# Patient Record
Sex: Male | Born: 1960 | Race: Black or African American | Hispanic: No | Marital: Single | Smoking: Never smoker
Health system: Southern US, Community
[De-identification: ages and names within clinical notes are randomized; demographics above are authoritative.]

## PROBLEM LIST (undated history)

## (undated) DIAGNOSIS — I10 Essential (primary) hypertension: Secondary | ICD-10-CM

---

## 1999-06-29 ENCOUNTER — Emergency Department (HOSPITAL_COMMUNITY): Admission: EM | Admit: 1999-06-29 | Discharge: 1999-06-29 | Payer: Self-pay | Admitting: Emergency Medicine

## 1999-06-30 ENCOUNTER — Encounter: Payer: Self-pay | Admitting: Emergency Medicine

## 2015-02-17 ENCOUNTER — Emergency Department (HOSPITAL_COMMUNITY)
Admission: EM | Admit: 2015-02-17 | Discharge: 2015-02-17 | Disposition: A | Discharge status: Home / Self Care | Payer: Self-pay | Attending: Emergency Medicine | Admitting: Emergency Medicine

## 2015-02-17 ENCOUNTER — Encounter (HOSPITAL_COMMUNITY): Payer: Self-pay | Admitting: *Deleted

## 2015-02-17 ENCOUNTER — Emergency Department (HOSPITAL_COMMUNITY): Payer: Self-pay

## 2015-02-17 DIAGNOSIS — Z79899 Other long term (current) drug therapy: Secondary | ICD-10-CM | POA: Insufficient documentation

## 2015-02-17 DIAGNOSIS — R0789 Other chest pain: Secondary | ICD-10-CM | POA: Insufficient documentation

## 2015-02-17 LAB — CBC
HCT: 44.4 % (ref 39.0–52.0)
Hemoglobin: 15.4 g/dL (ref 13.0–17.0)
MCH: 29.7 pg (ref 26.0–34.0)
MCHC: 34.7 g/dL (ref 30.0–36.0)
MCV: 85.5 fL (ref 78.0–100.0)
Platelets: 187 10*3/uL (ref 150–400)
RBC: 5.19 MIL/uL (ref 4.22–5.81)
RDW: 13.3 % (ref 11.5–15.5)
WBC: 3.8 10*3/uL — ABNORMAL LOW (ref 4.0–10.5)

## 2015-02-17 LAB — BASIC METABOLIC PANEL
Anion gap: 5 (ref 5–15)
BUN: 12 mg/dL (ref 6–20)
CALCIUM: 9.2 mg/dL (ref 8.9–10.3)
CO2: 29 mmol/L (ref 22–32)
CREATININE: 1.29 mg/dL — AB (ref 0.61–1.24)
Chloride: 109 mmol/L (ref 101–111)
GLUCOSE: 105 mg/dL — AB (ref 70–99)
Potassium: 4.1 mmol/L (ref 3.5–5.1)
Sodium: 143 mmol/L (ref 135–145)

## 2015-02-17 LAB — I-STAT TROPONIN, ED
TROPONIN I, POC: 0.01 ng/mL (ref 0.00–0.08)
TROPONIN I, POC: 0 ng/mL (ref 0.00–0.08)

## 2015-02-17 LAB — BRAIN NATRIURETIC PEPTIDE: B Natriuretic Peptide: 43.2 pg/mL (ref 0.0–100.0)

## 2015-02-17 IMAGING — CR DG CHEST 2V
2 series · 2 of 2 positions shown · non-contrast
Comparison: None currently available

CLINICAL DATA: Chest pain intermittent dizziness

EXAM:
CHEST  2 VIEW

[w chest pa]
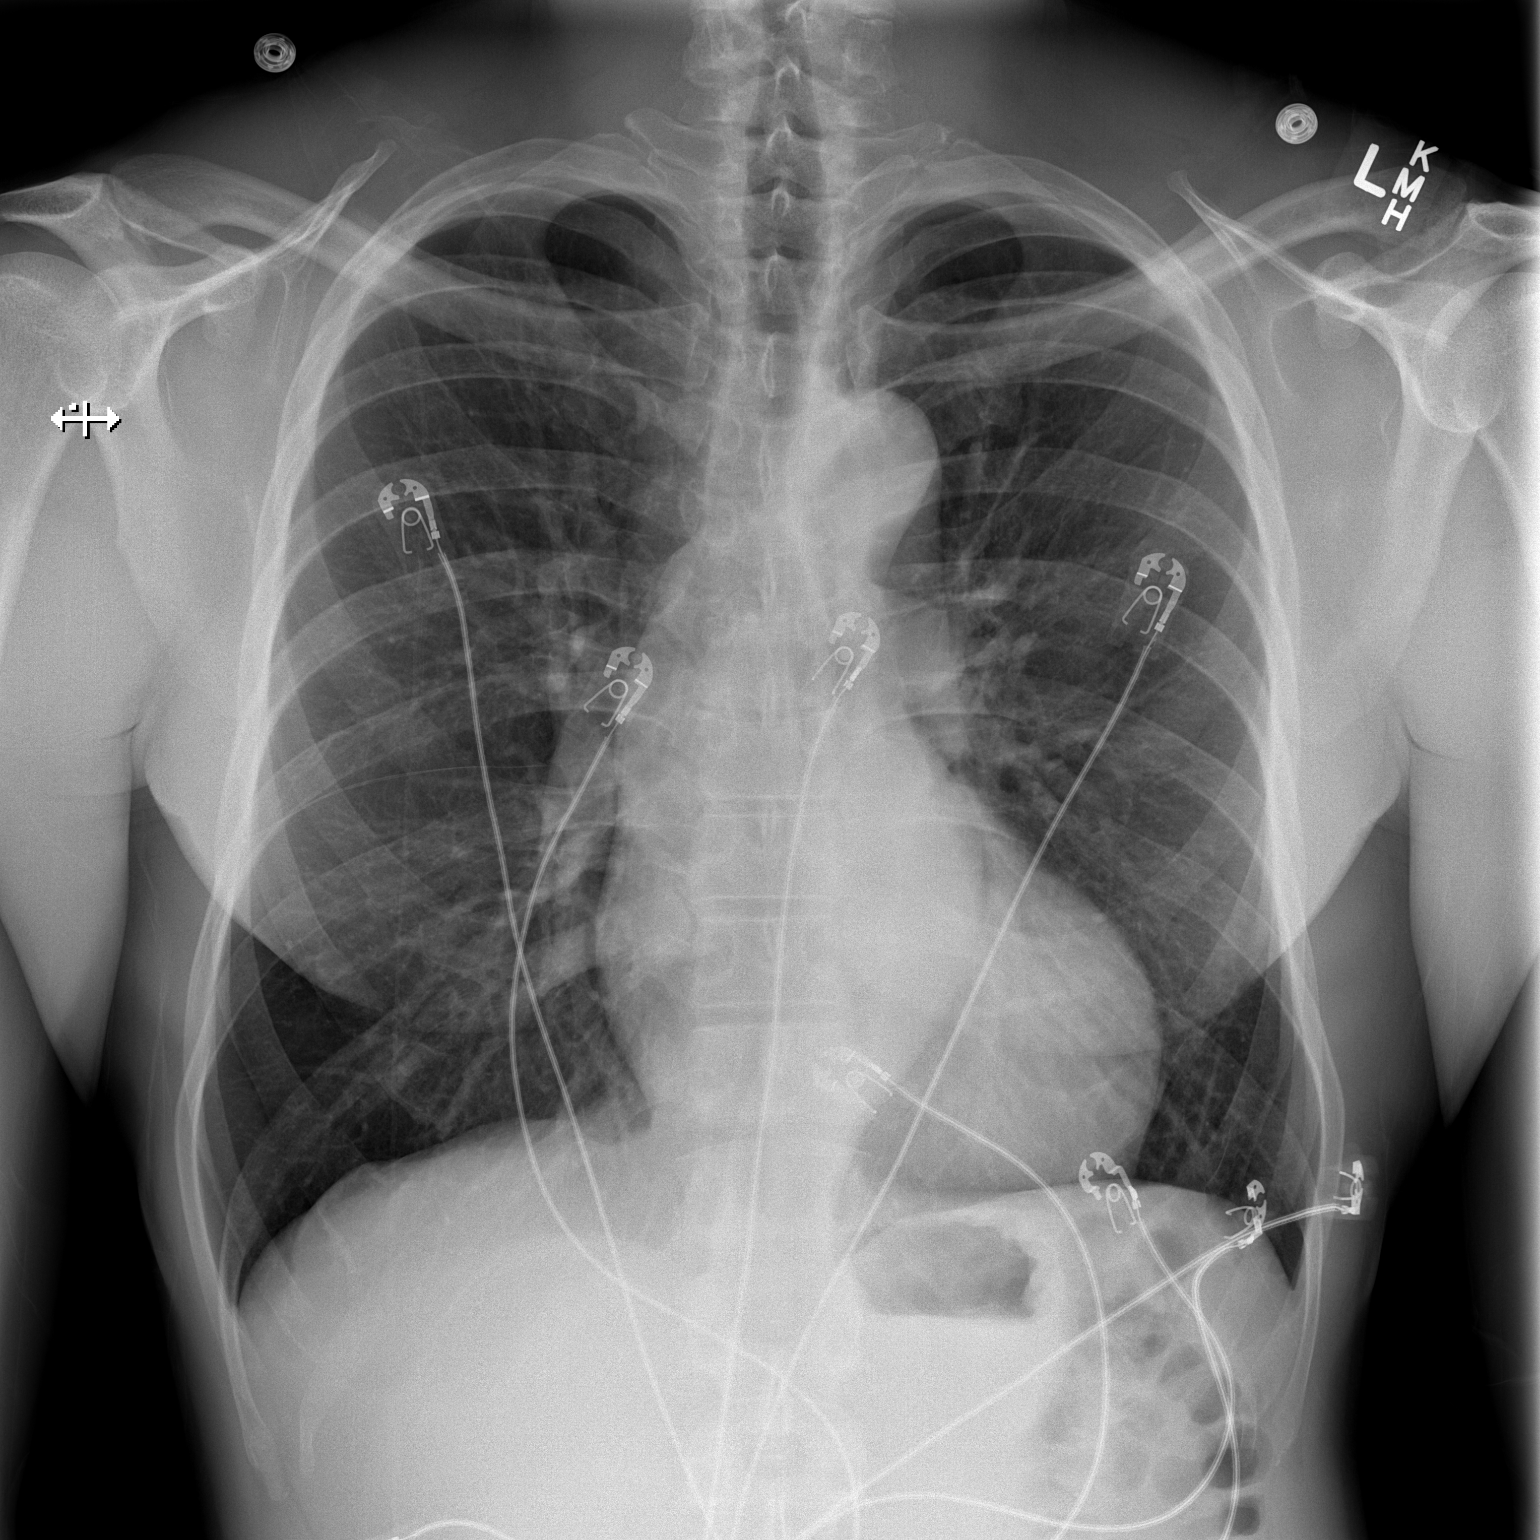

[w chest lat]
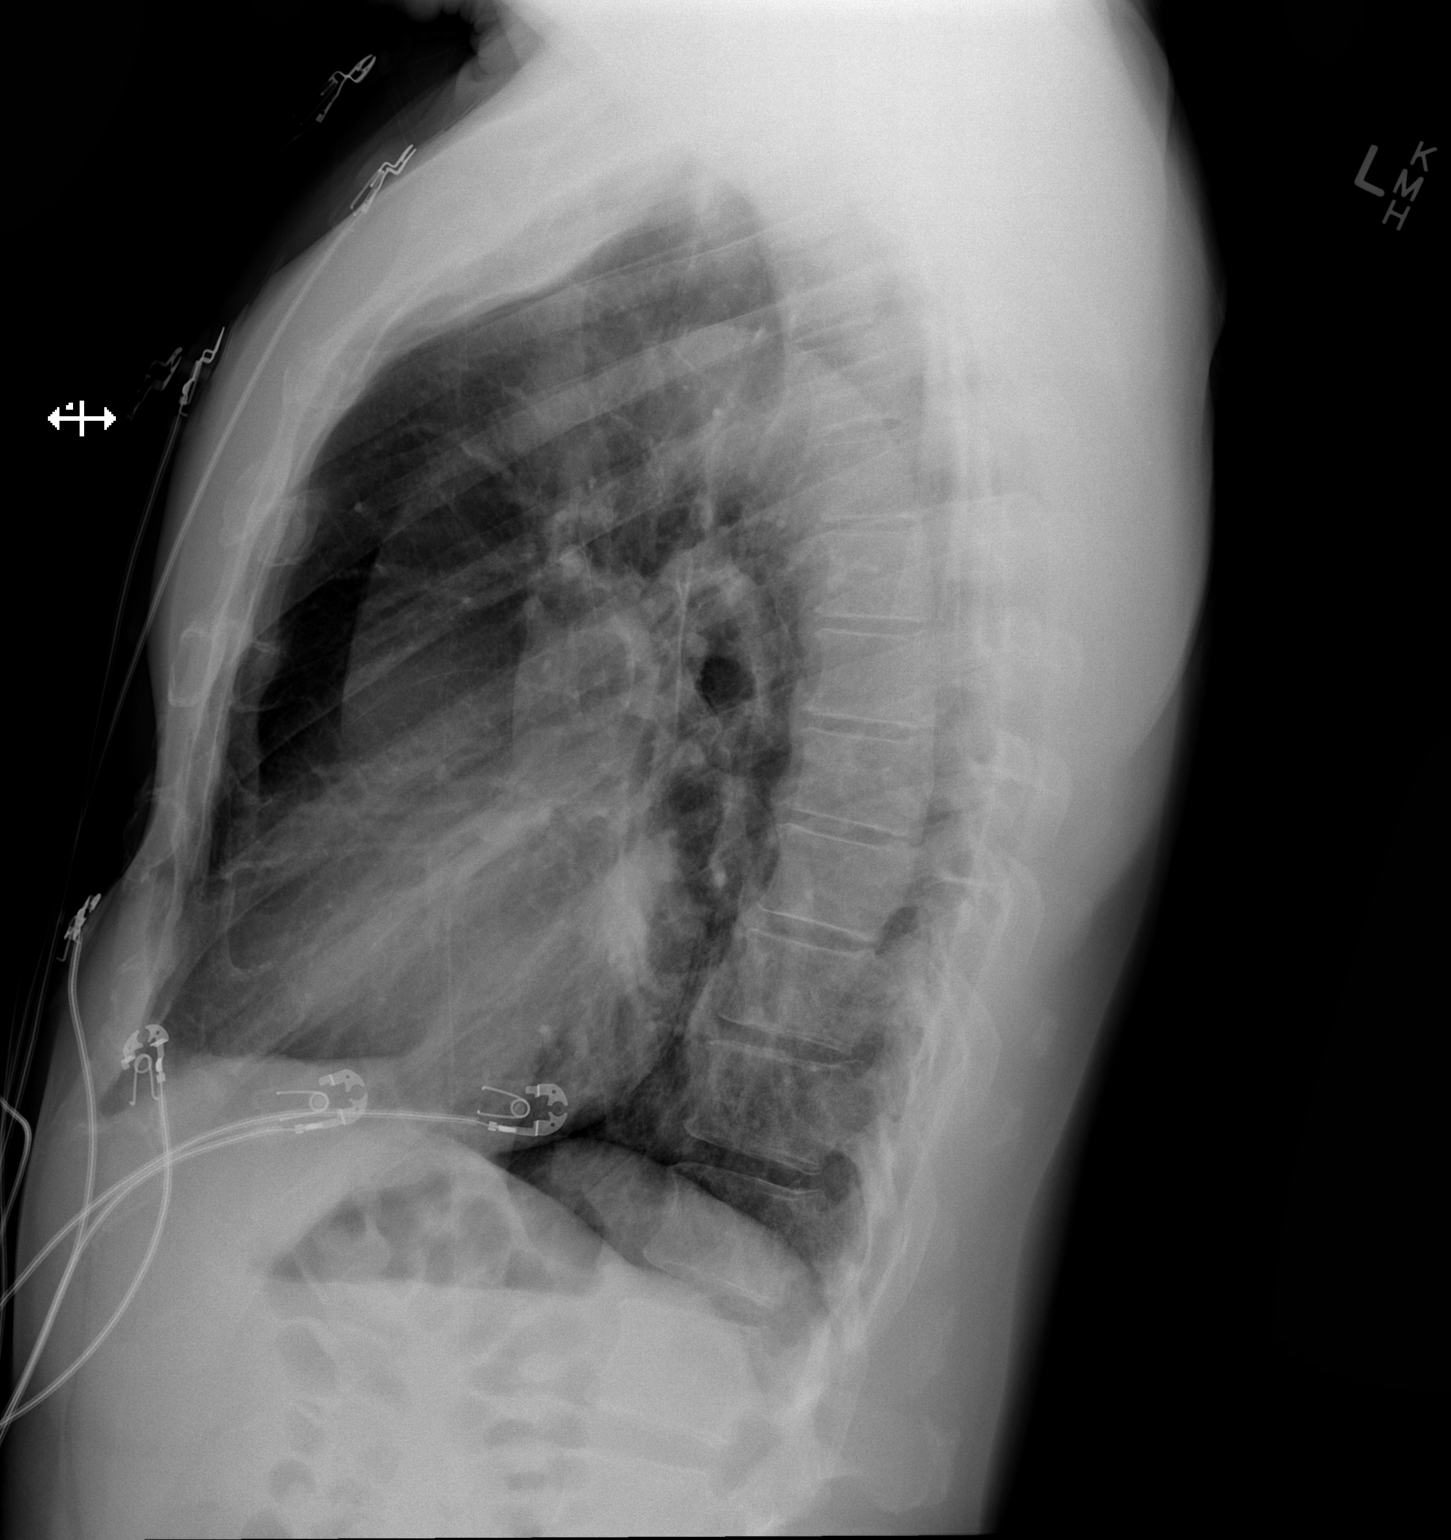

[2 of 2 positions shown; findings below may reference images not displayed]

FINDINGS: Aortic tortuosity and prominent left ventricular size for age. No
evidence of aortic valve calcification. This appearance can be seen
with chronic hypertension. Borderline Hyperinflation. There is no
edema, consolidation, effusion, or pneumothorax. No acute osseous
findings.
IMPRESSION: 1. No active cardiopulmonary disease.
2. Prominent left ventricle and aortic tortuosity for age, question
chronic hypertension.
3. Borderline hyperinflation.

## 2015-02-17 MED ORDER — HYDROCHLOROTHIAZIDE 12.5 MG PO TABS: 12.5000 mg | ORAL_TABLET | Freq: Every day | ORAL | Status: DC

## 2015-02-17 NOTE — ED Provider Notes (Signed)
BP 159/111 mmHg  Pulse 62  Temp(Src) 98.1 F (36.7 C) (Oral)  Resp 20  Ht 6' (1.829 m)  Wt 170 lb (77.111 kg)  BMI 23.05 kg/m2  SpO2 100%  take in sign out from PA Cartner.  The patient  Has had sharp chest pains. Intermittent, non exertional  Patient works out everyday 2 negative troponins,  heart score of 1. I spent time discussing hypertension with the patient and lifestyle modifications.   [facility]   [place], [place] 02/19/15 3570  Gwyneth Sprout, MD 02/19/15 [phone]

## 2015-02-17 NOTE — Discharge Instructions (Signed)
Chest Pain (Nonspecific) °It is often hard to give a specific diagnosis for the cause of chest pain. There is always a chance that your pain could be related to something serious, such as a heart attack or a blood clot in the lungs. You need to follow up with your health care provider for further evaluation. °CAUSES  °· Heartburn. °· Pneumonia or bronchitis. °· Anxiety or stress. °· Inflammation around your heart (pericarditis) or lung (pleuritis or pleurisy). °· A blood clot in the lung. °· A collapsed lung (pneumothorax). It can develop suddenly on its own (spontaneous pneumothorax) or from trauma to the chest. °· Shingles infection (herpes zoster virus). °The chest wall is composed of bones, muscles, and cartilage. Any of these can be the source of the pain. °· The bones can be bruised by injury. °· The muscles or cartilage can be strained by coughing or overwork. °· The cartilage can be affected by inflammation and become sore (costochondritis). °DIAGNOSIS  °Lab tests or other studies may be needed to find the cause of your pain. Your health care provider may have you take a test called an ambulatory electrocardiogram (ECG). An ECG records your heartbeat patterns over a 24-hour period. You may also have other tests, such as: °· Transthoracic echocardiogram (TTE). During echocardiography, sound waves are used to evaluate how blood flows through your heart. °· Transesophageal echocardiogram (TEE). °· Cardiac monitoring. This allows your health care provider to monitor your heart rate and rhythm in real time. °· Holter monitor. This is a portable device that records your heartbeat and can help diagnose heart arrhythmias. It allows your health care provider to track your heart activity for several days, if needed. °· Stress tests by exercise or by giving medicine that makes the heart beat faster. °TREATMENT  °· Treatment depends on what may be causing your chest pain. Treatment may include: °¨ Acid blockers for  heartburn. °¨ Anti-inflammatory medicine. °¨ Pain medicine for inflammatory conditions. °¨ Antibiotics if an infection is present. °· You may be advised to change lifestyle habits. This includes stopping smoking and avoiding alcohol, caffeine, and chocolate. °· You may be advised to keep your head raised (elevated) when sleeping. This reduces the chance of acid going backward from your stomach into your esophagus. °Most of the time, nonspecific chest pain will improve within 2-3 days with rest and mild pain medicine.  °HOME CARE INSTRUCTIONS  °· If antibiotics were prescribed, take them as directed. Finish them even if you start to feel better. °· For the next few days, avoid physical activities that bring on chest pain. Continue physical activities as directed. °· Do not use any tobacco products, including cigarettes, chewing tobacco, or electronic cigarettes. °· Avoid drinking alcohol. °· Only take medicine as directed by your health care provider. °· Follow your health care provider's suggestions for further testing if your chest pain does not go away. °· Keep any follow-up appointments you made. If you do not go to an appointment, you could develop lasting (chronic) problems with pain. If there is any problem keeping an appointment, call to reschedule. °SEEK MEDICAL CARE IF:  °· Your chest pain does not go away, even after treatment. °· You have a rash with blisters on your chest. °· You have a fever. °SEEK IMMEDIATE MEDICAL CARE IF:  °· You have increased chest pain or pain that spreads to your arm, neck, jaw, back, or abdomen. °· You have shortness of breath. °· You have an increasing cough, or you cough   up blood.  You have severe back or abdominal pain.  You feel nauseous or vomit.  You have severe weakness.  You faint.  You have chills. This is an emergency. Do not wait to see if the pain will go away. Get medical help at once. Call your local emergency services (911 in [place] Do not drive  yourself to the hospital. MAKE SURE YOU:   Understand these instructions.  Will watch your condition.  Will get help right away if you are not doing well or get worse. Document Released: 07/11/2005 Document Revised: 10/06/2013 Document Reviewed: 05/06/2008 [facility] Patient Information 2015 [place], [place]. This information is not intended to replace advice given to you by your health care provider. Make sure you discuss any questions you have with your health care provider.   You were evaluated in the ED today for your chest discomfort. There does not appear to be an emergent cause for your symptoms at this time. Your lab work, EKG, chest x-ray were reassuring. It is important for you to follow-up with primary care in order to establish medical care. He will also need to follow-up with cardiology for further evaluation and management of your symptoms. Please take your blood pressure medicine as directed. Return to ED for new or worsening symptoms.

## 2015-02-17 NOTE — Progress Notes (Addendum)
CM spoke with pt who confirms self pay [facility] resident with no pcp. CM discussed and provided written information for self pay pcps vs EDPs, importance of pcp for f/u care, www.needymeds.org, www.goodrx.com, discounted pharmacies and other [employer] such as [facility] [facility] [facility] , [facility], affordable care act,  financial assistance, self pay dental services, [place] med assist, DSS and  health department  Reviewed resources for [employer] self pay pcps like [facility], family medicine at [employer] [address], [facility] family practice, general medical clinics, [facility] urgent care plus others, medication resources, [facility] out patient pharmacies and housing Pt voiced understanding and appreciation of resources provided   Provided [facility] contact information Pt agreed to a referral Cm completed referral    Pt prefers to transition to [facility] services until "september" when he gets "married and gets on my fiance's insurance"

## 2015-02-17 NOTE — ED Provider Notes (Signed)
CSN: [id number]     Arrival date & time 02/17/15  1334 History   First MD Initiated Contact with Patient 02/17/15 1348     Chief Complaint  Patient presents with  . Chest Pain     (Consider location/radiation/quality/duration/timing/severity/associated sxs/prior Treatment) HPI Daniel Franklin is a 54 y.o. male who comes in for evaluation of intermittent dizziness and chest pain. Patient states he has had an intermittent history of dizziness for the past 6 months, will occur while walking, resting. Denies spinning sensation but reports "feeling off balance". This is a fleeting sensation. Denies any associated headache, numbness or weakness. He reports associated chest pain that is also been intermittent over the past few months. He characterizes the sensation as a sharp pain in his left chest that is exacerbated with certain movements and deep respiration. He rates the pain as a 2/10. Reports that he is generally very active and works out every day and has not experienced chest discomfort following strenuous exercise. Denies headache, shortness of breath, nausea or vomiting, diaphoresis, abdominal pain, urinary symptoms, syncope, dark or bloody stools. Also denies recent travel, surgeries, unilateral leg swelling, hemoptysis, personal history of cancer. Nonsmoker denies cocaine or other illicit substance use.  History reviewed. No pertinent past medical history. History reviewed. No pertinent past surgical history. History reviewed. No pertinent family history. History  Substance Use Topics  . Smoking status: Never Smoker   . Smokeless tobacco: Not on file  . Alcohol Use: Not on file    Review of Systems A 10 point review of systems was completed and was negative except for pertinent positives and negatives as mentioned in the history of present illness     Allergies  Review of patient's allergies indicates no known allergies.  Home Medications   Prior to Admission medications    Medication Sig Start Date End Date Taking? Authorizing Provider  hydrochlorothiazide (HYDRODIURIL) 12.5 MG tablet Take 1 tablet (12.5 mg total) by mouth daily. 02/17/15   Joycie Peek, PA-C   BP 174/111 mmHg  Pulse 88  Temp(Src) 98.5 F (36.9 C) (Oral)  Resp 20  Ht 6' (1.829 m)  Wt 170 lb (77.111 kg)  BMI 23.05 kg/m2  SpO2 100% Physical Exam  Constitutional: He is oriented to person, place, and time. He appears well-developed and well-nourished. No distress.  Overall well-appearing, healthy black male.  HENT:  Head: Normocephalic and atraumatic.  Mouth/Throat: Oropharynx is clear and moist.  Eyes: Conjunctivae are normal. Pupils are equal, round, and reactive to light. Right eye exhibits no discharge. Left eye exhibits no discharge. No scleral icterus.  Neck: Normal range of motion. Neck supple.  Cardiovascular: Normal rate, regular rhythm and normal heart sounds.   Pulmonary/Chest: Effort normal and breath sounds normal. No respiratory distress. He has no wheezes. He has no rales. He exhibits no tenderness.  Abdominal: Soft. He exhibits no distension and no mass. There is no tenderness. There is no rebound and no guarding.  Musculoskeletal: He exhibits no tenderness.  Neurological: He is alert and oriented to person, place, and time.  Cranial Nerves II-XII grossly intact  Skin: Skin is warm. No rash noted. He is not diaphoretic.  Psychiatric: He has a normal mood and affect.  Nursing note and vitals reviewed.   ED Course  Procedures (including critical care time) Labs Review Labs Reviewed  CBC - Abnormal; Notable for the following:    WBC 3.8 (*)    All other components within normal limits  BASIC METABOLIC PANEL -  Abnormal; Notable for the following:    Glucose, Bld 105 (*)    Creatinine, Ser 1.29 (*)    All other components within normal limits  BRAIN NATRIURETIC PEPTIDE  I-STAT TROPOININ, ED    Imaging Review Dg Chest 2 View  02/17/2015   CLINICAL DATA:  Chest  pain intermittent dizziness  EXAM: CHEST  2 VIEW  COMPARISON:  None currently available  FINDINGS: Aortic tortuosity and prominent left ventricular size for age. No evidence of aortic valve calcification. This appearance can be seen with chronic hypertension. Borderline Hyperinflation. There is no edema, consolidation, effusion, or pneumothorax. No acute osseous findings.  IMPRESSION: 1. No active cardiopulmonary disease. 2. Prominent left ventricle and aortic tortuosity for age, question chronic hypertension. 3. Borderline hyperinflation.   Electronically Signed   By: Marnee Spring M.D.   On: 02/17/2015 14:29     EKG Interpretation None      ED ECG REPORT   Date: 02/17/2015  Rate: 80  Rhythm: normal sinus rhythm  QRS Axis: left  Intervals: normal  ST/T Wave abnormalities: normal  Conduction Disutrbances:none  Narrative Interpretation:   Old EKG Reviewed: none available  I have personally reviewed the EKG tracing and agree with the computerized printout as noted.  Meds given in ED:  Medications - No data to display  New Prescriptions   HYDROCHLOROTHIAZIDE (HYDRODIURIL) 12.5 MG TABLET    Take 1 tablet (12.5 mg total) by mouth daily.   Filed Vitals:   02/17/15 1343  BP: 174/111  Pulse: 88  Temp: 98.5 F (36.9 C)  TempSrc: Oral  Resp: 20  Height: 6' (1.829 m)  Weight: 170 lb (77.111 kg)  SpO2: 100%    MDM  Vitals stable - WNL -afebrile Pt resting comfortably in ED. No chest discomfort in ED PE--Lung exam normal. Cardiac auscultation reveals no murmurs rubs or gallops. Grossly Benign Physical Exam Labwork: Initial/DeltaTroponin negative. EKG reassuring.  Labs otherwise noncontributory Imaging: CXR shows no active cardiopulmonary disease. Prominent left ventricle and aortic tortuosity for age, question chronic hypertension.  DDX: Patient with atypical chest discomfort. Clinical picture and exam today not consistent with ACS/dissection. Heart score 2. No evidence of  spontaneous pneumothorax, esophageal rupture or other mediastinitis. Low well's score, doubt PE. No evidence of myocarditis, endocarditis, pericarditis.   Patient care signed out to [employer], PA-C. Plan is delta troponin and if no new objective findings, patient may follow-up outpatient with cardiology. Will initiate antihypertensives with HCTZ and encourage patient to follow up with PCP/[place] [facility] for further evaluation and management of symptoms. Prior to sign out, I discussed and reviewed this case with my attending, Dr. Rhunette Croft   Final diagnoses:  Chest discomfort        Joycie Peek, PA-C 02/17/15 1601  Derwood Kaplan, MD 02/17/15 1623

## 2015-02-17 NOTE — ED Notes (Signed)
Pt in c/o intermittent dizziness for the last week, this morning developed chest pain, fatigue, states dizziness became worse, no distress noted

## 2015-03-01 ENCOUNTER — Ambulatory Visit: Payer: Self-pay

## 2016-04-13 ENCOUNTER — Inpatient Hospital Stay (HOSPITAL_COMMUNITY)
Admission: EM | Admit: 2016-04-13 | Discharge: 2016-04-17 | DRG: 281 | Disposition: A | Discharge status: Home / Self Care | Payer: Self-pay | Attending: Internal Medicine | Admitting: Internal Medicine

## 2016-04-13 ENCOUNTER — Encounter (HOSPITAL_COMMUNITY): Payer: Self-pay | Admitting: Emergency Medicine

## 2016-04-13 ENCOUNTER — Emergency Department (HOSPITAL_COMMUNITY): Payer: Self-pay

## 2016-04-13 DIAGNOSIS — I160 Hypertensive urgency: Secondary | ICD-10-CM | POA: Diagnosis present

## 2016-04-13 DIAGNOSIS — R079 Chest pain, unspecified: Secondary | ICD-10-CM | POA: Diagnosis present

## 2016-04-13 DIAGNOSIS — I472 Ventricular tachycardia: Secondary | ICD-10-CM | POA: Diagnosis present

## 2016-04-13 DIAGNOSIS — I214 Non-ST elevation (NSTEMI) myocardial infarction: Secondary | ICD-10-CM | POA: Diagnosis present

## 2016-04-13 DIAGNOSIS — N189 Chronic kidney disease, unspecified: Secondary | ICD-10-CM | POA: Diagnosis present

## 2016-04-13 DIAGNOSIS — Z8249 Family history of ischemic heart disease and other diseases of the circulatory system: Secondary | ICD-10-CM

## 2016-04-13 DIAGNOSIS — R7989 Other specified abnormal findings of blood chemistry: Secondary | ICD-10-CM | POA: Diagnosis present

## 2016-04-13 DIAGNOSIS — I169 Hypertensive crisis, unspecified: Principal | ICD-10-CM | POA: Diagnosis present

## 2016-04-13 DIAGNOSIS — Z9114 Patient's other noncompliance with medication regimen: Secondary | ICD-10-CM

## 2016-04-13 DIAGNOSIS — N179 Acute kidney failure, unspecified: Secondary | ICD-10-CM | POA: Diagnosis present

## 2016-04-13 DIAGNOSIS — R778 Other specified abnormalities of plasma proteins: Secondary | ICD-10-CM

## 2016-04-13 DIAGNOSIS — R0789 Other chest pain: Secondary | ICD-10-CM | POA: Diagnosis present

## 2016-04-13 DIAGNOSIS — I429 Cardiomyopathy, unspecified: Secondary | ICD-10-CM | POA: Insufficient documentation

## 2016-04-13 DIAGNOSIS — I471 Supraventricular tachycardia: Secondary | ICD-10-CM | POA: Diagnosis present

## 2016-04-13 DIAGNOSIS — I130 Hypertensive heart and chronic kidney disease with heart failure and stage 1 through stage 4 chronic kidney disease, or unspecified chronic kidney disease: Secondary | ICD-10-CM | POA: Diagnosis present

## 2016-04-13 DIAGNOSIS — E860 Dehydration: Secondary | ICD-10-CM | POA: Diagnosis present

## 2016-04-13 DIAGNOSIS — R001 Bradycardia, unspecified: Secondary | ICD-10-CM | POA: Diagnosis present

## 2016-04-13 DIAGNOSIS — I161 Hypertensive emergency: Secondary | ICD-10-CM | POA: Diagnosis present

## 2016-04-13 DIAGNOSIS — I5042 Chronic combined systolic (congestive) and diastolic (congestive) heart failure: Secondary | ICD-10-CM | POA: Diagnosis present

## 2016-04-13 DIAGNOSIS — I10 Essential (primary) hypertension: Secondary | ICD-10-CM

## 2016-04-13 DIAGNOSIS — I255 Ischemic cardiomyopathy: Secondary | ICD-10-CM | POA: Diagnosis present

## 2016-04-13 HISTORY — DX: Essential (primary) hypertension: I10

## 2016-04-13 LAB — BASIC METABOLIC PANEL
Anion gap: 6 (ref 5–15)
BUN: 17 mg/dL (ref 6–20)
CALCIUM: 9.2 mg/dL (ref 8.9–10.3)
CO2: 27 mmol/L (ref 22–32)
CREATININE: 1.25 mg/dL — AB (ref 0.61–1.24)
Chloride: 106 mmol/L (ref 101–111)
Glucose, Bld: 97 mg/dL (ref 65–99)
Potassium: 4.3 mmol/L (ref 3.5–5.1)
Sodium: 139 mmol/L (ref 135–145)

## 2016-04-13 LAB — CBC
HCT: 41.4 % (ref 39.0–52.0)
Hemoglobin: 14.9 g/dL (ref 13.0–17.0)
MCH: 29.5 pg (ref 26.0–34.0)
MCHC: 36 g/dL (ref 30.0–36.0)
MCV: 82 fL (ref 78.0–100.0)
PLATELETS: 178 10*3/uL (ref 150–400)
RBC: 5.05 MIL/uL (ref 4.22–5.81)
RDW: 13.9 % (ref 11.5–15.5)
WBC: 4 10*3/uL (ref 4.0–10.5)

## 2016-04-13 LAB — I-STAT TROPONIN, ED: TROPONIN I, POC: 0.08 ng/mL (ref 0.00–0.08)

## 2016-04-13 MED ORDER — AMLODIPINE BESYLATE 10 MG PO TABS
10.0000 mg | ORAL_TABLET | Freq: Once | ORAL | Status: AC
  Administered 2016-04-13: 10 mg via ORAL
  Filled 2016-04-13: qty 1

## 2016-04-13 MED ORDER — ASPIRIN 81 MG PO CHEW
324.0000 mg | CHEWABLE_TABLET | Freq: Once | ORAL | Status: AC
  Administered 2016-04-13: 324 mg via ORAL
  Filled 2016-04-13: qty 4

## 2016-04-13 NOTE — ED Notes (Signed)
Patient went to [facility] for chest tightness. Patient states this has been going on for 1-2 weeks. Patient states his pain worsened today. Per documentation from [facility] patient also has HTN today. Patient states he was told once before he had HTN, but that it hasn't been a problem.

## 2016-04-13 NOTE — Progress Notes (Signed)
EDCM spoke to patient at bedside. Patient confirms he does not have a pcp or insurance living in [place].  [facility] provided patient with pamphlet to [facility], informed patient of services there and walk in times.  EDCM also provided patient with list of pcps who accept self pay patients, list of discount pharmacies and websites needymeds.org and GoodRX.com for medication assistance, phone number to inquire about the orange card, phone number to inquire about Medicaid, phone number to inquire about the Affordable Care Act, financial resources in the community such as local churches, salvation army, urban ministries, and dental assistance for uninsured patients.  Patient thankful for resources.  No further EDCM needs at this time.

## 2016-04-13 NOTE — ED Notes (Signed)
Per wife pt will now stay for testing.

## 2016-04-13 NOTE — ED Notes (Signed)
Pt stated he has not followed up with PCP and has only taken antihypertensive meds given to him here in May. Pt aware of repeat troponin process but states he has a prior engagement at 10pm and ask if he can return tomorrow for additional testing. Pt A & O, chest tightness unchanged. Pt unwilling to allow NT to place on CCM. Family at bedside.

## 2016-04-13 NOTE — ED Provider Notes (Signed)
CSN: [id number]     Arrival date & time 04/13/16  2020 History   First MD Initiated Contact with Patient 04/13/16 2103     Chief Complaint  Patient presents with  . Chest Pain    sent by [facility]     (Consider location/radiation/quality/duration/timing/severity/associated sxs/prior Treatment) HPI   Tightness in chest for one week, across top of rib cage, doesn't radiate. When stretching feels better. Initially denies exertional component, however then reports when he does anything he feels tightness and when he lays down or rests he feels better. When working out muscles get tight (pull ups) then they stay tight. Not working out as much since this started.  No dyspnea, nausea, diaphoresis.  Pain better laying down. Not worse with arm movements.  Bayer aspirin helped a little bit.  No DM/chol/smoking  Heart disease in family--sister has pacemaker. Dad needed a stent in 62s.      Past Medical History  Diagnosis Date  . Hypertension    History reviewed. No pertinent past surgical history. History reviewed. No pertinent family history. Social History  Substance Use Topics  . Smoking status: Never Smoker   . Smokeless tobacco: None  . Alcohol Use: Yes     Comment: 1-2 x a week    Review of Systems  Constitutional: Negative for fever.  HENT: Negative for sore throat.   Eyes: Negative for visual disturbance.  Respiratory: Positive for chest tightness. Negative for cough and shortness of breath.   Cardiovascular: Positive for chest pain.  Gastrointestinal: Negative for nausea, vomiting, abdominal pain, diarrhea and blood in stool.  Genitourinary: Negative for difficulty urinating.  Musculoskeletal: Negative for back pain and neck stiffness.  Skin: Negative for rash.  Neurological: Negative for syncope and headaches.      Allergies  Review of patient's allergies indicates no known allergies.  Home Medications   Prior to Admission medications   Medication Sig Start Date End  Date Taking? Authorizing Provider  OVER THE COUNTER MEDICATION Take 1 capsule by mouth daily. Nugenix Natural Testosterone Booster   Yes Historical Provider, MD   BP 176/119 mmHg  Pulse 53  Temp(Src) 97.8 F (36.6 C) (Oral)  Resp 15  Ht 6' (1.829 m)  Wt 170 lb (77.111 kg)  BMI 23.05 kg/m2  SpO2 99% Physical Exam  Constitutional: He is oriented to person, place, and time. He appears well-developed and well-nourished. No distress.  HENT:  Head: Normocephalic and atraumatic.  Eyes: Conjunctivae and EOM are normal.  Neck: Normal range of motion.  Cardiovascular: Normal rate, regular rhythm, normal heart sounds and intact distal pulses.  Exam reveals no gallop and no friction rub.   No murmur heard. Pulmonary/Chest: Effort normal and breath sounds normal. No respiratory distress. He has no wheezes. He has no rales. He exhibits no tenderness.  Abdominal: Soft. He exhibits no distension. There is no tenderness. There is no guarding.  Musculoskeletal: He exhibits no edema.  Neurological: He is alert and oriented to person, place, and time.  Skin: Skin is warm and dry. He is not diaphoretic.  Nursing note and vitals reviewed.   ED Course  Procedures (including critical care time) Labs Review Labs Reviewed  BASIC METABOLIC PANEL - Abnormal; Notable for the following:    Creatinine, Ser 1.25 (*)    All other components within normal limits  I-STAT TROPOININ, ED - Abnormal; Notable for the following:    Troponin i, poc 0.10 (*)    All other components within normal limits  CBC  Rosezena Sensor, ED  Rosezena Sensor, ED    Imaging Review Dg Chest 2 View  04/13/2016  CLINICAL DATA:  55 year old male with chest pain and tightness EXAM: CHEST  2 VIEW COMPARISON:  Chest radiograph dated 02/17/2015 FINDINGS: Two views of the chest do not demonstrate a focal consolidation. There is minimal blunting of the right costophrenic angle scratched similar to prior study which may represent a small  right pleural effusion versus scarring. There is no pneumothorax but stable for cardiac silhouette. No acute osseous pathology. IMPRESSION: No acute cardiopulmonary process. Electronically Signed   By: Elgie Collard M.D.   On: 04/13/2016 20:45   I have personally reviewed and evaluated these images and lab results as part of my medical decision-making.   EKG Interpretation   Date/Time:  Friday April 13 2016 20:37:51 EDT Ventricular Rate:  49 PR Interval:    QRS Duration: 100 QT Interval:  422 QTC Calculation: 381 R Axis:   23 Text Interpretation:  Sinus bradycardia Left ventricular hypertrophy  Confirmed by Lincoln Brigham [phone]) on 04/13/2016 8:40:52 PM Also confirmed by  [facility] MD, Demesha Boorman ([id number])  on 04/13/2016 10:58:39 PM      MDM   Final diagnoses:  Chest pain, unspecified chest pain type  Essential hypertension  Elevated troponin    55 year old male with history of hypertension not on medications presents with concern of chest pain. Differential diagnosis for chest pain includes pulmonary embolus, dissection, pneumothorax, pneumonia, ACS, myocarditis, pericarditis.  EKG was done and evaluate by me and showed no acute ST changes and no signs of pericarditis, findings of bradycardia and LVH.  Chest x-ray was done and evaluated by me and radiology and showed no sign of pneumonia or pneumothorax.  Patient without dyspnea, no hypoxia, no tachypnea, low suspicion for pulmonary embolus. He has equal pulses in all 4 extremities, no sign of dissection on x-ray, and have low suspicion for this based on history and physical exam. Initial troponin was negative, however delta troponin is elevated at 0.1. Patient received aspirin.  Elevated troponin may be secondary to ACS versus hypertension. Patient was chest pain-free at time of my evaluation. Given po amlodipine for htn given CP free.  Consulted hospitalist for admission.   Alvira Monday, MD 04/14/16 1224

## 2016-04-13 NOTE — ED Notes (Signed)
Pt stated he did not need to be placed on the monitor, he is leaving at 10pm. He needs to be somewhere

## 2016-04-14 ENCOUNTER — Observation Stay (HOSPITAL_COMMUNITY): Payer: Self-pay

## 2016-04-14 ENCOUNTER — Encounter (HOSPITAL_COMMUNITY): Payer: Self-pay | Admitting: Nurse Practitioner

## 2016-04-14 DIAGNOSIS — R0789 Other chest pain: Secondary | ICD-10-CM | POA: Diagnosis present

## 2016-04-14 DIAGNOSIS — N179 Acute kidney failure, unspecified: Secondary | ICD-10-CM | POA: Diagnosis present

## 2016-04-14 DIAGNOSIS — R7989 Other specified abnormal findings of blood chemistry: Secondary | ICD-10-CM | POA: Diagnosis present

## 2016-04-14 DIAGNOSIS — I160 Hypertensive urgency: Secondary | ICD-10-CM | POA: Diagnosis present

## 2016-04-14 DIAGNOSIS — R079 Chest pain, unspecified: Secondary | ICD-10-CM

## 2016-04-14 DIAGNOSIS — I214 Non-ST elevation (NSTEMI) myocardial infarction: Secondary | ICD-10-CM | POA: Diagnosis present

## 2016-04-14 LAB — ECHOCARDIOGRAM COMPLETE
E decel time: 264 msec
EERAT: 6.18
FS: 35 % (ref 28–44)
Height: 72 in
IVS/LV PW RATIO, ED: 0.98
LA diam index: 2.07 cm/m2
LA vol A4C: 45 ml
LA vol index: 24 mL/m2
LASIZE: 41 mm
LAVOL: 47.5 mL
LEFT ATRIUM END SYS DIAM: 41 mm
LV E/e'average: 6.18
LVEEMED: 6.18
LVELAT: 9.25 cm/s
LVOT VTI: 17.2 cm
LVOT area: 4.52 cm2
LVOT diameter: 24 mm
LVOTPV: 82.5 cm/s
LVOTSV: 78 mL
MV Dec: 264
MV pk A vel: 63.1 m/s
MVPKEVEL: 57.2 m/s
PW: 10.9 mm — AB (ref 0.6–1.1)
TDI e' lateral: 9.25
TDI e' medial: 4.68
Weight: 2702.4 oz

## 2016-04-14 LAB — BASIC METABOLIC PANEL
ANION GAP: 6 (ref 5–15)
BUN: 13 mg/dL (ref 6–20)
CALCIUM: 8.8 mg/dL — AB (ref 8.9–10.3)
CO2: 26 mmol/L (ref 22–32)
Chloride: 108 mmol/L (ref 101–111)
Creatinine, Ser: 1.16 mg/dL (ref 0.61–1.24)
Glucose, Bld: 88 mg/dL (ref 65–99)
Potassium: 3.3 mmol/L — ABNORMAL LOW (ref 3.5–5.1)
SODIUM: 140 mmol/L (ref 135–145)

## 2016-04-14 LAB — I-STAT TROPONIN, ED: TROPONIN I, POC: 0.1 ng/mL — AB (ref 0.00–0.08)

## 2016-04-14 LAB — LIPID PANEL
Cholesterol: 182 mg/dL (ref 0–200)
HDL: 80 mg/dL (ref >40)
LDL Cholesterol: 89 mg/dL (ref 0–99)
Total CHOL/HDL Ratio: 2.3 RATIO
Triglycerides: 65 mg/dL (ref <150)
VLDL: 13 mg/dL (ref 0–40)

## 2016-04-14 LAB — MAGNESIUM: Magnesium: 1.9 mg/dL (ref 1.7–2.4)

## 2016-04-14 LAB — TROPONIN I
TROPONIN I: 0.22 ng/mL — AB (ref <0.03)
TROPONIN I: 0.17 ng/mL — AB (ref <0.03)
Troponin I: 0.16 ng/mL (ref <0.03)

## 2016-04-14 LAB — PROTIME-INR
INR: 1.15 (ref 0.00–1.49)
Prothrombin Time: 14.4 seconds (ref 11.6–15.2)

## 2016-04-14 LAB — RAPID URINE DRUG SCREEN, HOSP PERFORMED
Amphetamines: NOT DETECTED
BARBITURATES: NOT DETECTED
BENZODIAZEPINES: NOT DETECTED
COCAINE: NOT DETECTED
Opiates: NOT DETECTED
TETRAHYDROCANNABINOL: NOT DETECTED

## 2016-04-14 LAB — SODIUM, URINE, RANDOM: Sodium, Ur: 136 mmol/L

## 2016-04-14 LAB — APTT: APTT: 30 s (ref 24–37)

## 2016-04-14 LAB — CREATININE, URINE, RANDOM: Creatinine, Urine: 63.84 mg/dL

## 2016-04-14 LAB — BRAIN NATRIURETIC PEPTIDE: B NATRIURETIC PEPTIDE 5: 40.8 pg/mL (ref 0.0–100.0)

## 2016-04-14 MED ORDER — MORPHINE SULFATE (PF) 2 MG/ML IV SOLN: 2.0000 mg | INTRAVENOUS | Status: DC | PRN

## 2016-04-14 MED ORDER — AMLODIPINE BESYLATE 10 MG PO TABS
10.0000 mg | ORAL_TABLET | Freq: Every day | ORAL | Status: DC
  Administered 2016-04-14: 10 mg via ORAL
  Filled 2016-04-14: qty 1

## 2016-04-14 MED ORDER — ATORVASTATIN CALCIUM 40 MG PO TABS
40.0000 mg | ORAL_TABLET | Freq: Every day | ORAL | Status: DC
  Administered 2016-04-14 – 2016-04-15 (×3): 40 mg via ORAL
  Filled 2016-04-14 (×3): qty 1

## 2016-04-14 MED ORDER — ONDANSETRON HCL 4 MG/2ML IJ SOLN: 4.0000 mg | Freq: Four times a day (QID) | INTRAMUSCULAR | Status: DC | PRN

## 2016-04-14 MED ORDER — HEPARIN SODIUM (PORCINE) 5000 UNIT/ML IJ SOLN
5000.0000 [IU] | Freq: Three times a day (TID) | INTRAMUSCULAR | Status: DC
  Administered 2016-04-14 – 2016-04-16 (×6): 5000 [IU] via SUBCUTANEOUS
  Filled 2016-04-14 (×6): qty 1

## 2016-04-14 MED ORDER — NITROGLYCERIN 0.4 MG SL SUBL: 0.4000 mg | SUBLINGUAL_TABLET | SUBLINGUAL | Status: DC | PRN

## 2016-04-14 MED ORDER — SODIUM CHLORIDE 0.9 % IV SOLN
INTRAVENOUS | Status: DC
  Administered 2016-04-14: 02:00:00 via INTRAVENOUS

## 2016-04-14 MED ORDER — POTASSIUM CHLORIDE CRYS ER 20 MEQ PO TBCR
40.0000 meq | EXTENDED_RELEASE_TABLET | Freq: Four times a day (QID) | ORAL | Status: AC
  Administered 2016-04-14 (×2): 40 meq via ORAL
  Filled 2016-04-14 (×2): qty 2

## 2016-04-14 MED ORDER — HEPARIN BOLUS VIA INFUSION
4000.0000 [IU] | Freq: Once | INTRAVENOUS | Status: AC
  Administered 2016-04-14: 4000 [IU] via INTRAVENOUS
  Filled 2016-04-14: qty 4000

## 2016-04-14 MED ORDER — CARVEDILOL 3.125 MG PO TABS
3.1250 mg | ORAL_TABLET | Freq: Two times a day (BID) | ORAL | Status: DC
  Administered 2016-04-14: 3.125 mg via ORAL
  Filled 2016-04-14 (×2): qty 1

## 2016-04-14 MED ORDER — HYDRALAZINE HCL 20 MG/ML IJ SOLN: 5.0000 mg | INTRAMUSCULAR | Status: DC | PRN

## 2016-04-14 MED ORDER — ACETAMINOPHEN 325 MG PO TABS: 650.0000 mg | ORAL_TABLET | ORAL | Status: DC | PRN

## 2016-04-14 MED ORDER — ALPRAZOLAM 0.25 MG PO TABS: 0.2500 mg | ORAL_TABLET | Freq: Two times a day (BID) | ORAL | Status: DC | PRN

## 2016-04-14 MED ORDER — HEPARIN SODIUM (PORCINE) 5000 UNIT/ML IJ SOLN: 5000.0000 [IU] | Freq: Three times a day (TID) | INTRAMUSCULAR | Status: DC

## 2016-04-14 MED ORDER — CARVEDILOL 6.25 MG PO TABS
6.2500 mg | ORAL_TABLET | Freq: Two times a day (BID) | ORAL | Status: DC
  Administered 2016-04-15: 6.25 mg via ORAL
  Filled 2016-04-14: qty 1

## 2016-04-14 MED ORDER — HEPARIN (PORCINE) IN NACL 100-0.45 UNIT/ML-% IJ SOLN
900.0000 [IU]/h | INTRAMUSCULAR | Status: DC
  Administered 2016-04-14: 900 [IU]/h via INTRAVENOUS
  Filled 2016-04-14: qty 250

## 2016-04-14 MED ORDER — ASPIRIN 81 MG PO CHEW
324.0000 mg | CHEWABLE_TABLET | Freq: Every day | ORAL | Status: DC
  Administered 2016-04-14 – 2016-04-16 (×3): 324 mg via ORAL
  Filled 2016-04-14 (×3): qty 4

## 2016-04-14 MED ORDER — LISINOPRIL 2.5 MG PO TABS
2.5000 mg | ORAL_TABLET | Freq: Every day | ORAL | Status: DC
  Administered 2016-04-14: 2.5 mg via ORAL
  Filled 2016-04-14 (×3): qty 1

## 2016-04-14 MED ORDER — MAGNESIUM SULFATE IN D5W 1-5 GM/100ML-% IV SOLN
1.0000 g | Freq: Once | INTRAVENOUS | Status: AC
  Administered 2016-04-14: 1 g via INTRAVENOUS
  Filled 2016-04-14: qty 100

## 2016-04-14 NOTE — Progress Notes (Signed)
PROGRESS NOTE                                                                                                                                                                                                             Patient Demographics:    Daniel Franklin, is a 55 y.o. male, DOB - [birth year], TRR:[id number]  Admit date - 04/13/2016   Admitting Physician Lorretta Harp, MD  Outpatient Primary MD for the patient is No primary care provider on file.  LOS -   Chief Complaint  Patient presents with  . Chest Pain    sent by UC       Brief Narrative    Daniel Franklin is a 55 y.o. male with medical history significant of hypertension, medication noncompliance, who presents with chest tightness.  Patient reports that he has been having chest tightness in the past 2 weeks, which has worsened today. His chest tightness is located substernal area, constant, 4 out of 10 in severity, nonradiating. It is exertional. Patient does not have cough or shortness of breath. He states that he did not take blood pressure medications for more than a year. Patient does not have nausea, vomiting, abdominal pain, symptoms of UTI or unilateral weakness. Denies drug use.  ED Course: pt was found to have an elevated blood pressure 183/117, elevated troponin 0.08--> 0.10-->0.22, WBC 4.0, temperature normal, bradycardia, AKI with Cre 1.25, negative chest x-ray for acute abnormalities. Patient is placed on telemetry bed for observation.     Subjective:    Daniel Franklin today has, No headache, No chest pain, No abdominal pain - No Nausea, No new weakness tingling or numbness, No Cough - SOB.     Assessment  & Plan :     1.Chest pain due to hypertensive crisis. Chest pain-free now, blood pressure medications adjusted with good control, troponin trend is flat and in non-ACS pattern, EKG nonacute, obtain echocardiogram to evaluate EF and wall motion.  Continue on aspirin, statin and beta blocker. Stop heparin drip. Cardiology to evaluate in the morning. Urine drug screen stable.  2. Hypertensive crisis. Due to noncompliance with blood pressure medications, medications have been adjusted. She shouldn't has been counseled. Blood pressure better.  3. 20 beat asymptomatic run of V. tach. Replace potassium, given IV magnesium, placed on Coreg, check echocardiogram, discussed with cardiologist Dr.  Hilty he will see the patient in the morning. Monitor on telemetry.  4. Mild AKI -  due to dehydration resolved.    Family Communication  :  None present  Code Status :  Full  Diet : Heart Healthy  Disposition Plan  :  The inpatient on telemetry  Consults  :  Cards Dr. Rennis Golden, we'll see the patient tomorrow  Procedures  :    TTE  DVT Prophylaxis  :    Heparin   Lab Results  Component Value Date   PLT 178 04/13/2016    Inpatient Medications  Scheduled Meds: . amLODipine  10 mg Oral Daily  . aspirin  324 mg Oral Daily  . atorvastatin  40 mg Oral q1800  . carvedilol  3.125 mg Oral BID WC  . magnesium sulfate 1 - 4 g bolus IVPB  1 g Intravenous Once   Continuous Infusions: . sodium chloride 75 mL/hr at 04/14/16 0213   PRN Meds:.acetaminophen, ALPRAZolam, hydrALAZINE, morphine injection, nitroGLYCERIN, ondansetron (ZOFRAN) IV  Antibiotics  :    Anti-infectives    None         Objective:   Filed Vitals:   04/14/16 0100 04/14/16 0152 04/14/16 0421 04/14/16 0739  BP: 163/112 147/94 135/86 132/73  Pulse: 65 58 54   Temp:  98 F (36.7 C) 97.8 F (36.6 C)   TempSrc:  Oral Oral   Resp: 15 16 16    Height:  6' (1.829 m)    Weight:  76.613 kg (168 lb 14.4 oz)    SpO2: 100% 100% 98%     Wt Readings from Last 3 Encounters:  04/14/16 76.613 kg (168 lb 14.4 oz)  02/17/15 77.111 kg (170 lb)     Intake/Output Summary (Last 24 hours) at 04/14/16 1037 Last data filed at 04/14/16 0542  Gross per 24 hour  Intake 315.95 ml    Output    200 ml  Net 115.95 ml     Physical Exam  Awake Alert, Oriented X 3, No new F.N deficits, Normal affect [place].AT,PERRAL Supple Neck,No JVD, No cervical lymphadenopathy appriciated.  Symmetrical Chest wall movement, Good air movement bilaterally, CTAB RRR,No Gallops,Rubs or new Murmurs, No Parasternal Heave +ve B.Sounds, Abd Soft, No tenderness, No organomegaly appriciated, No rebound - guarding or rigidity. No Cyanosis, Clubbing or edema, No new Rash or bruise      Data Review:    CBC  Recent Labs Lab 04/13/16 2050  WBC 4.0  HGB 14.9  HCT 41.4  PLT 178  MCV 82.0  MCH 29.5  MCHC 36.0  RDW 13.9    Chemistries   Recent Labs Lab 04/13/16 2050 04/14/16 0745  NA 139 140  K 4.3 3.3*  CL 106 108  CO2 27 26  GLUCOSE 97 88  BUN 17 13  CREATININE 1.25* 1.16  CALCIUM 9.2 8.8*  MG  --  1.9   ------------------------------------------------------------------------------------------------------------------ No results for input(s): CHOL, HDL, LDLCALC, TRIG, CHOLHDL, LDLDIRECT in the last 72 hours.  No results found for: HGBA1C ------------------------------------------------------------------------------------------------------------------ No results for input(s): TSH, T4TOTAL, T3FREE, THYROIDAB in the last 72 hours.  Invalid input(s): FREET3 ------------------------------------------------------------------------------------------------------------------ No results for input(s): VITAMINB12, FOLATE, FERRITIN, TIBC, IRON, RETICCTPCT in the last 72 hours.  Coagulation profile  Recent Labs Lab 04/14/16 0214  INR 1.15    No results for input(s): DDIMER in the last 72 hours.  Cardiac Enzymes  Recent Labs Lab 04/14/16 0214 04/14/16 0708  TROPONINI 0.22* 0.17*   ------------------------------------------------------------------------------------------------------------------    Component  Value Date/Time   BNP 40.8 04/14/2016 0214    Micro  Results No results found for this or any previous visit (from the past 240 hour(s)).  Radiology Reports Dg Chest 2 View  04/13/2016  CLINICAL DATA:  55 year old male with chest pain and tightness EXAM: CHEST  2 VIEW COMPARISON:  Chest radiograph dated 02/17/2015 FINDINGS: Two views of the chest do not demonstrate a focal consolidation. There is minimal blunting of the right costophrenic angle scratched similar to prior study which may represent a small right pleural effusion versus scarring. There is no pneumothorax but stable for cardiac silhouette. No acute osseous pathology. IMPRESSION: No acute cardiopulmonary process. Electronically Signed   By: Elgie Collard M.D.   On: 04/13/2016 20:45    Time Spent in minutes  30   SINGH,PRASHANT K M.D on 04/14/2016 at 10:37 AM  Between 7am to 7pm - Pager - [phone]  After 7pm go to [email or web address] - password [facility]  [facility] -  Office  [phone]

## 2016-04-14 NOTE — Progress Notes (Signed)
Echocardiogram 2D Echocardiogram has been performed.  Daniel Franklin 04/14/2016, 12:44 PM

## 2016-04-14 NOTE — Progress Notes (Signed)
ANTICOAGULATION CONSULT NOTE - Initial Consult  Pharmacy Consult for Heparin Indication: chest pain/ACS  No Known Allergies  Patient Measurements: Height: 6' (182.9 cm) Weight: 168 lb 14.4 oz (76.613 kg) IBW/kg (Calculated) : 77.6 Heparin Dosing Weight: actual body weight  Vital Signs: Temp: 98 F (36.7 C) (07/01 0152) Temp Source: Oral (07/01 0152) BP: 147/94 mmHg (07/01 0152) Pulse Rate: 58 (07/01 0152)  Labs:  Recent Labs  04/13/16 2050 04/14/16 0214  HGB 14.9  --   HCT 41.4  --   PLT 178  --   APTT  --  30  LABPROT  --  14.4  INR  --  1.15  CREATININE 1.25*  --   TROPONINI  --  0.22*    Estimated Creatinine Clearance: 73.2 mL/min (by C-G formula based on Cr of 1.25).   Medical History: Past Medical History  Diagnosis Date  . Hypertension     Medications:  Scheduled:  . amLODipine  10 mg Oral Daily  . aspirin  324 mg Oral Daily  . atorvastatin  40 mg Oral q1800  . heparin  4,000 Units Intravenous Once   Infusions:  . sodium chloride 75 mL/hr at 04/14/16 0998  . heparin      Assessment:  55 yr male presented to ED with chest tightness.  Elevated troponin.  On no oral anticoagulation PTA.  Pharmacy consulted to dose IV Heparin for ACS/STEMI  Goal of Therapy:  Heparin level 0.3-0.7 units/ml Monitor platelets by anticoagulation protocol: Yes   Plan:   Heparin 4000 unit IV bolus x 1 followed by 900 units/hr  Heparin level 6 hr after heparin started  Check daily heparin level & CBC   Hargun Spurling, Joselyn Glassman, PharmD 04/14/2016,3:53 AM

## 2016-04-14 NOTE — Progress Notes (Signed)
CRITICAL VALUE ALERT  Critical value received:  Troponin 0.22  Date of notification:  04/14/2016   Time of notification:  0341  Critical value read back:yes  Nurse who received alert:  Elisabeth Pigeon   MD notified (1st page):  Clyde Lundborg   Time of first page:  [phone]  MD notified (2nd page):  Time of second page:  Responding MD:  Clyde Lundborg  Time MD responded:  [phone]

## 2016-04-14 NOTE — Progress Notes (Signed)
pt had 20 beats V-tach. States he did feel palpitation in chest. Denied feeling palpitations at home. Patient returned to SB with HR - 59. BP 132/73. No complaints at this time. MD notified. Will continue to monitor.  Earnest Conroy. Clelia Croft, RN

## 2016-04-14 NOTE — ED Notes (Signed)
EDP Schlossman notified of 0.10 troponin

## 2016-04-14 NOTE — ED Notes (Signed)
Dr. Clyde Lundborg at bedside. Pt anxious d/t hospital stay. Pt states he has never stayed in the hospital in his life. Wife at bedside.

## 2016-04-14 NOTE — H&P (Signed)
History and Physical    Daniel Franklin:[id number] DOB: November 30, 1960 DOA: 04/13/2016  Referring MD/NP/PA:   PCP: No primary care provider on file.   Patient coming from:  The patient is coming from home.  At baseline, pt is independent for most of ADL.  Chief Complaint: Chest tightness  HPI: Daniel Franklin is a 55 y.o. male with medical history significant of hypertension, medication noncompliance, who presents with chest tightness.  Patient reports that he has been having chest tightness in the past 2 weeks, which has worsened today. His chest tightness is located substernal area, constant, 4 out of 10 in severity, nonradiating. It is exertional. Patient does not have cough or shortness of breath. He states that he did not take blood pressure medications for more than a year. Patient does not have nausea, vomiting, abdominal pain, symptoms of UTI or unilateral weakness. Denies drug use.  ED Course: pt was found to have an elevated blood pressure 183/117, elevated troponin 0.08--> 0.10-->0.22, WBC 4.0, temperature normal, bradycardia,  AKI with Cre 1.25, negative chest x-ray for acute abnormalities. Patient is placed on telemetry bed for observation.   Review of Systems:   General: no fevers, chills, no changes in body weight, has fatigue HEENT: no blurry vision, hearing changes or sore throat Pulm: no dyspnea, coughing, wheezing CV: has chest tightness, no palpitations Abd: no nausea, vomiting, abdominal pain, diarrhea, constipation GU: no dysuria, burning on urination, increased urinary frequency, hematuria  Ext: no leg edema Neuro: no unilateral weakness, numbness, or tingling, no vision change or hearing loss Skin: no rash MSK: No muscle spasm, no deformity, no limitation of range of movement in spin Heme: No easy bruising.  Travel history: No recent long distant travel.  Allergy: No Known Allergies  Past Medical History  Diagnosis Date  . Hypertension     History  reviewed. No pertinent past surgical history.  Social History:  reports that he has never smoked. He does not have any smokeless tobacco history on file. He reports that he drinks alcohol. He reports that he does not use illicit drugs.  Family History:  Family History  Problem Relation Age of Onset  . Heart attack Mother   . Heart disease Father   . Coronary artery disease Sister      Prior to Admission medications   Medication Sig Start Date End Date Taking? Authorizing Provider  OVER THE COUNTER MEDICATION Take 1 capsule by mouth daily. Nugenix Natural Testosterone Booster   Yes Historical Provider, MD    Physical Exam: Filed Vitals:   04/14/16 0000 04/14/16 0040 04/14/16 0100 04/14/16 0152  BP: 176/119 156/109 163/112 147/94  Pulse: 53 55 65 58  Temp:    98 F (36.7 C)  TempSrc:    Oral  Resp: Height:    6' (1.829 m)  Weight:    76.613 kg (168 lb 14.4 oz)  SpO2: 99% 99% 100% 100%   General: Not in acute distress HEENT:       Eyes: PERRL, EOMI, no scleral icterus.       ENT: No discharge from the ears and nose, no pharynx injection, no tonsillar enlargement.        Neck: No JVD, no bruit, no mass felt. Heme: No neck lymph node enlargement. Cardiac: S1/S2, RRR, No murmurs, No gallops or rubs. Pulm: No rales, wheezing, rhonchi or rubs. Abd: Soft, nondistended, nontender, no rebound pain, no organomegaly, BS present. GU: No hematuria Ext: No pitting  leg edema bilaterally. 2+DP/PT pulse bilaterally. Musculoskeletal: No joint deformities, No joint redness or warmth, no limitation of ROM in spin. Skin: No rashes.  Neuro: Alert, oriented X3, cranial nerves II-XII grossly intact, moves all extremities normally. Psych: Patient is not psychotic, no suicidal or hemocidal ideation.  Labs on Admission: I have personally reviewed following labs and imaging studies  CBC:  Recent Labs Lab 04/13/16 2050  WBC 4.0  HGB 14.9  HCT 41.4  MCV 82.0  PLT 178   Basic  Metabolic Panel:  Recent Labs Lab 04/13/16 2050  NA 139  K 4.3  CL 106  CO2 27  GLUCOSE 97  BUN 17  CREATININE 1.25*  CALCIUM 9.2   GFR: Estimated Creatinine Clearance: 73.2 mL/min (by C-G formula based on Cr of 1.25). Liver Function Tests: No results for input(s): AST, ALT, ALKPHOS, BILITOT, PROT, ALBUMIN in the last 168 hours. No results for input(s): LIPASE, AMYLASE in the last 168 hours. No results for input(s): AMMONIA in the last 168 hours. Coagulation Profile:  Recent Labs Lab 04/14/16 0214  INR 1.15   Cardiac Enzymes:  Recent Labs Lab 04/14/16 0214  TROPONINI 0.22*   BNP (last 3 results) No results for input(s): PROBNP in the last 8760 hours. HbA1C: No results for input(s): HGBA1C in the last 72 hours. CBG: No results for input(s): GLUCAP in the last 168 hours. Lipid Profile: No results for input(s): CHOL, HDL, LDLCALC, TRIG, CHOLHDL, LDLDIRECT in the last 72 hours. Thyroid Function Tests: No results for input(s): TSH, T4TOTAL, FREET4, T3FREE, THYROIDAB in the last 72 hours. Anemia Panel: No results for input(s): VITAMINB12, FOLATE, FERRITIN, TIBC, IRON, RETICCTPCT in the last 72 hours. Urine analysis: No results found for: COLORURINE, APPEARANCEUR, LABSPEC, PHURINE, GLUCOSEU, HGBUR, BILIRUBINUR, KETONESUR, PROTEINUR, UROBILINOGEN, NITRITE, LEUKOCYTESUR Sepsis Labs: @LABRCNTIP (procalcitonin:4,lacticidven:4) )No results found for this or any previous visit (from the past 240 hour(s)).   Radiological Exams on Admission: Dg Chest 2 View  04/13/2016  CLINICAL DATA:  55 year old male with chest pain and tightness EXAM: CHEST  2 VIEW COMPARISON:  Chest radiograph dated 02/17/2015 FINDINGS: Two views of the chest do not demonstrate a focal consolidation. There is minimal blunting of the right costophrenic angle scratched similar to prior study which may represent a small right pleural effusion versus scarring. There is no pneumothorax but stable for cardiac  silhouette. No acute osseous pathology. IMPRESSION: No acute cardiopulmonary process. Electronically Signed   By: Elgie Collard M.D.   On: 04/13/2016 20:45     EKG: Independently reviewed. Sinus rhythm, QTC 380 lying, bradycardia, no ischemic change.  Assessment/Plan Principal Problem:   Chest pain Active Problems:   Hypertensive urgency   Elevated troponin   AKI (acute kidney injury) (HCC)   Chest tightness   NSTEMI (non-ST elevated myocardial infarction) (HCC)   Chest pain: Likely due to demanding ischemia secondary to hypertensive urgency, but patient's troponin is treating up from 0.08-->0.22, indicating non-STEMI. Chest x-ray is negative. No signs of DVT or shortness of breath, unlikely to have pulmonary medicine.   - Will place on tele bed for obs - start IV heparin - cycle CE q6 x3 and repeat her EKG in the am  - prn Nitroglycerin, Morphine -start aspirin, lipitor  - Risk factor stratification: will check FLP, UDS and A1C  - 2d echo - please call Card in AM  Hypertensive urgency: Blood pressure 183/117. This is due to medication noncompliance. -IV hydralazine when necessary -Start amlodipine 10 mg daily -Consult to case manager  AKI: Likely due to prerenal secondary to dehydration, also possible due to uncontrolled Bp. - IVF: NS at 75 cc/h - Check FeNa - Follow up renal function by BMP - Avoid ACEI and NSAIDs  DVT ppx: on IV heparin Code Status: Full code Family Communication:  Yes, patient's wife at bed side Disposition Plan:  Anticipate discharge back to previous home environment Consults called:  none Admission status: Obs / tele  Date of Service 04/14/2016    Lorretta Harp [facility] Pager [phone]  If 7PM-7AM, please contact night-coverage [email or web address] Password [email or web address] 04/14/2016, 4:03 AM

## 2016-04-15 ENCOUNTER — Observation Stay (HOSPITAL_COMMUNITY)
Admit: 2016-04-15 | Discharge: 2016-04-15 | Disposition: A | Discharge status: Home / Self Care | Payer: Self-pay | Attending: Internal Medicine | Admitting: Internal Medicine

## 2016-04-15 ENCOUNTER — Ambulatory Visit (HOSPITAL_COMMUNITY)
Admit: 2016-04-15 | Discharge: 2016-04-15 | Disposition: A | Discharge status: Home / Self Care | Payer: Self-pay | Attending: Internal Medicine | Admitting: Internal Medicine

## 2016-04-15 DIAGNOSIS — R079 Chest pain, unspecified: Secondary | ICD-10-CM

## 2016-04-15 DIAGNOSIS — R0789 Other chest pain: Secondary | ICD-10-CM

## 2016-04-15 DIAGNOSIS — R072 Precordial pain: Secondary | ICD-10-CM

## 2016-04-15 DIAGNOSIS — I214 Non-ST elevation (NSTEMI) myocardial infarction: Secondary | ICD-10-CM

## 2016-04-15 LAB — NM MYOCAR MULTI W/SPECT W/WALL MOTION / EF
CSEPED: 0 min
CSEPPHR: 98 {beats}/min
Estimated workload: 1 METS
Exercise duration (sec): 0 s
MPHR: 166 {beats}/min
Percent HR: 59 %
RPE: 0
Rest HR: 47 {beats}/min

## 2016-04-15 LAB — BASIC METABOLIC PANEL
ANION GAP: 5 (ref 5–15)
BUN: 16 mg/dL (ref 6–20)
CO2: 24 mmol/L (ref 22–32)
Calcium: 9.2 mg/dL (ref 8.9–10.3)
Chloride: 111 mmol/L (ref 101–111)
Creatinine, Ser: 1.43 mg/dL — ABNORMAL HIGH (ref 0.61–1.24)
GFR calc Af Amer: >60 mL/min (ref >60)
GFR, EST NON AFRICAN AMERICAN: 54 mL/min — AB (ref >60)
GLUCOSE: 96 mg/dL (ref 65–99)
Potassium: 4 mmol/L (ref 3.5–5.1)
Sodium: 140 mmol/L (ref 135–145)

## 2016-04-15 LAB — MAGNESIUM: Magnesium: 2.1 mg/dL (ref 1.7–2.4)

## 2016-04-15 MED ORDER — TECHNETIUM TC 99M TETROFOSMIN IV KIT
10.0000 | PACK | Freq: Once | INTRAVENOUS | Status: AC | PRN
  Administered 2016-04-15: 10 via INTRAVENOUS

## 2016-04-15 MED ORDER — SODIUM CHLORIDE 0.9 % IV SOLN: 250.0000 mL | INTRAVENOUS | Status: DC | PRN

## 2016-04-15 MED ORDER — CARVEDILOL 3.125 MG PO TABS
3.1250 mg | ORAL_TABLET | Freq: Two times a day (BID) | ORAL | Status: DC
  Administered 2016-04-15 – 2016-04-17 (×3): 3.125 mg via ORAL
  Filled 2016-04-15 (×3): qty 1

## 2016-04-15 MED ORDER — SODIUM CHLORIDE 0.9% FLUSH: 3.0000 mL | INTRAVENOUS | Status: DC | PRN

## 2016-04-15 MED ORDER — REGADENOSON 0.4 MG/5ML IV SOLN
INTRAVENOUS | Status: AC
  Filled 2016-04-15: qty 5

## 2016-04-15 MED ORDER — REGADENOSON 0.4 MG/5ML IV SOLN
0.4000 mg | Freq: Once | INTRAVENOUS | Status: AC
  Administered 2016-04-15: 0.4 mg via INTRAVENOUS

## 2016-04-15 MED ORDER — ISOSORB DINITRATE-HYDRALAZINE 20-37.5 MG PO TABS
1.0000 | ORAL_TABLET | Freq: Three times a day (TID) | ORAL | Status: DC
  Administered 2016-04-15 (×2): 1 via ORAL
  Filled 2016-04-15 (×2): qty 1

## 2016-04-15 MED ORDER — TECHNETIUM TC 99M TETROFOSMIN IV KIT
30.0000 | PACK | Freq: Once | INTRAVENOUS | Status: AC | PRN
  Administered 2016-04-15: 30 via INTRAVENOUS

## 2016-04-15 MED ORDER — SODIUM CHLORIDE 0.9 % IV SOLN
INTRAVENOUS | Status: DC
  Administered 2016-04-15 – 2016-04-16 (×3): via INTRAVENOUS

## 2016-04-15 MED ORDER — SODIUM CHLORIDE 0.9 % IV SOLN: INTRAVENOUS | Status: DC

## 2016-04-15 MED ORDER — SODIUM CHLORIDE 0.9% FLUSH: 3.0000 mL | Freq: Two times a day (BID) | INTRAVENOUS | Status: DC

## 2016-04-15 MED ORDER — SODIUM CHLORIDE 0.9 % IV BOLUS (SEPSIS)
250.0000 mL | Freq: Once | INTRAVENOUS | Status: AC
  Administered 2016-04-15: 250 mL via INTRAVENOUS

## 2016-04-15 NOTE — Progress Notes (Signed)
PROGRESS NOTE                                                                                                                                                                                                             Patient Demographics:    Daniel Franklin, is a 55 y.o. male, DOB - May 02, 1961, ZOX:[id number]  Admit date - 04/13/2016   Admitting Physician Lorretta Harp, MD  Outpatient Primary MD for the patient is No primary care provider on file.  LOS -   Chief Complaint  Patient presents with  . Chest Pain    sent by UC       Brief Narrative    Daniel Franklin is a 55 y.o. male with medical history significant of hypertension, medication noncompliance, who presents with chest tightness.  Patient reports that he has been having chest tightness in the past 2 weeks, which has worsened today. His chest tightness is located substernal area, constant, 4 out of 10 in severity, nonradiating. It is exertional. Patient does not have cough or shortness of breath. He states that he did not take blood pressure medications for more than a year. Patient does not have nausea, vomiting, abdominal pain, symptoms of UTI or unilateral weakness. Denies drug use.  ED Course: pt was found to have an elevated blood pressure 183/117, elevated troponin 0.08--> 0.10-->0.22, WBC 4.0, temperature normal, bradycardia, AKI with Cre 1.25, negative chest x-ray for acute abnormalities. Patient is placed on telemetry bed for observation.     Subjective:    Daniel Franklin today has, No headache, No chest pain, No abdominal pain - No Nausea, No new weakness tingling or numbness, No Cough - SOB.     Assessment  & Plan :     1.Chest pain due to hypertensive crisis. Chest pain-free now, blood pressure medications adjusted with good control, troponin trend was flat and in non-ACS pattern, EKG non acute. Continue on aspirin, statin and beta blocker. Cardiology  following, Lexiscan +ve will need L.Heart Cath in am.  2. Hypertensive crisis. Due to noncompliance with blood pressure medications, medications have been adjusted. She shouldn't has been counseled. Blood pressure better.  3. 20 beat asymptomatic run of V. tach. Replace potassium, given IV magnesium, placed on Coreg, EF 45%, discussed with cardiologist Dr. Rennis Golden he will see the patient in the morning. Monitor  on telemetry.  4. Mild AKI -  due to dehydration, IVF.  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 Rennis Golden, we'll see the patient tomorrow  Procedures  :    TTE   Left ventricle: The cavity size was normal. Wall thickness was  increased in a pattern of mild LVH. Systolic function was  moderately to severely reduced. The estimated ejection fraction  was in the range of 30% to 35%. Diffuse hypokinesis. Although no  diagnostic regional wall motion abnormality was identified, this  possibility cannot be completely excluded on the basis of this  study. Doppler parameters are consistent with abnormal left  ventricular relaxation (grade 1 diastolic dysfunction).  Lexiscan - Intermidiate +ve  DVT Prophylaxis  :    Heparin   Lab Results  Component Value Date   PLT 178 04/13/2016    Inpatient Medications  Scheduled Meds: . aspirin  324 mg Oral Daily  . atorvastatin  40 mg Oral q1800  . carvedilol  6.25 mg Oral BID WC  . heparin subcutaneous  5,000 Units Subcutaneous Q8H  . isosorbide-hydrALAZINE  1 tablet Oral TID  . sodium chloride  250 mL Intravenous Once   Continuous Infusions:   PRN Meds:.acetaminophen, ALPRAZolam, hydrALAZINE, morphine injection, nitroGLYCERIN, ondansetron (ZOFRAN) IV  Antibiotics  :    Anti-infectives    None         Objective:    Filed Vitals:   04/15/16 0534 04/15/16 0822 04/15/16 1257 04/15/16 1430  BP: 117/65  139/86 98/60  Pulse: 56 57 57 53  Temp: 98.5 F (36.9 C)     TempSrc: Oral     Resp: 16  18   Height:      Weight:      SpO2: 99%  100%     Wt Readings from Last 3 Encounters:  04/14/16 76.613 kg (168 lb 14.4 oz)  02/17/15 77.111 kg (170 lb)     Intake/Output Summary (Last 24 hours) at 04/15/16 1454 Last data filed at 04/14/16 1820  Gross per 24 hour  Intake 1307.5 ml  Output      0 ml  Net 1307.5 ml     Physical Exam  Awake Alert, Oriented X 3, No new F.N deficits, Normal affect [place].AT,PERRAL Supple Neck,No JVD, No cervical lymphadenopathy appriciated.  Symmetrical Chest wall movement, Good air movement bilaterally, CTAB RRR,No Gallops,Rubs or new Murmurs, No Parasternal Heave +ve B.Sounds, Abd Soft, No tenderness, No organomegaly appriciated, No rebound - guarding or rigidity. No Cyanosis, Clubbing or edema, No new Rash or bruise      Data Review:    CBC  Recent Labs Lab 04/13/16 2050  WBC 4.0  HGB 14.9  HCT 41.4  PLT 178  MCV 82.0  MCH 29.5  MCHC 36.0  RDW 13.9    Chemistries   Recent Labs Lab 04/13/16 2050 04/14/16 0745 04/15/16 0507  NA 139 140 140  K 4.3 3.3* 4.0  CL 106 108 111  CO2 27 26 24   GLUCOSE 97 88 96  BUN 17 13 16   CREATININE 1.25* 1.16 1.43*  CALCIUM 9.2 8.8* 9.2  MG  --  1.9 2.1   ------------------------------------------------------------------------------------------------------------------  Recent Labs  04/14/16 0214  CHOL 182  HDL 80  LDLCALC 89  TRIG 65  CHOLHDL 2.3    No results found for: HGBA1C ------------------------------------------------------------------------------------------------------------------ No results for input(s): TSH, T4TOTAL, T3FREE, THYROIDAB in the last 72 hours.  Invalid input(s):  FREET3 ------------------------------------------------------------------------------------------------------------------ No results for input(s): VITAMINB12, FOLATE, FERRITIN, TIBC, IRON, RETICCTPCT in the last 72 hours.  Coagulation profile  Recent Labs Lab 04/14/16 0214  INR 1.15    No results for input(s): DDIMER in the last 72 hours.  Cardiac Enzymes  Recent Labs Lab 04/14/16 0214 04/14/16 0708 04/14/16 1250  TROPONINI 0.22* 0.17* 0.16*   ------------------------------------------------------------------------------------------------------------------    Component Value Date/Time   BNP 40.8 04/14/2016 0214    Micro Results No results found for this or any previous visit (from the past 240 hour(s)).  Radiology Reports Dg Chest 2 View  04/13/2016  CLINICAL DATA:  55 year old male with chest pain and tightness EXAM: CHEST  2 VIEW COMPARISON:  Chest radiograph dated 02/17/2015 FINDINGS: Two views of the chest do not demonstrate a focal consolidation. There is minimal blunting of the right costophrenic angle scratched similar to prior study which may represent a small right pleural effusion versus scarring. There is no pneumothorax but stable for cardiac silhouette. No acute osseous pathology. IMPRESSION: No acute cardiopulmonary process. Electronically Signed   By: Elgie Collard M.D.   On: 04/13/2016 20:45   Nm Myocar Multi W/spect W/wall Motion / Ef  04/15/2016  CLINICAL DATA:  Chest pain.  Hypertension.  Cardiomyopathy. EXAM: MYOCARDIAL IMAGING WITH SPECT (REST AND PHARMACOLOGIC-STRESS) GATED LEFT VENTRICULAR WALL MOTION STUDY LEFT VENTRICULAR EJECTION FRACTION TECHNIQUE: Standard myocardial SPECT imaging was performed after resting intravenous injection of 10 mCi Tc-5m tetrofosmin. Subsequently, intravenous infusion of Lexiscan was performed under the supervision of the Cardiology staff. At peak effect of the drug, 30 mCi Tc-103m tetrofosmin was injected intravenously and  standard myocardial SPECT imaging was performed. Quantitative gated imaging was also performed to evaluate left ventricular wall motion, and estimate left ventricular ejection fraction. COMPARISON:  None. FINDINGS: Perfusion: Moderate to large perfusion defect seen in the inferior wall on stress and resting images, which is predominantly fixed. However there is a small area of reversibility seen in the distal lateral wall on resting images, suggesting reversible peri-infarct ischemia. Wall Motion: Mild inferior and septal wall hypokinesis noted. Mild left ventricular dilatation. Left Ventricular Ejection Fraction: 44 % End diastolic volume 158 ml End systolic volume 88 ml IMPRESSION: 1. Probable inferior wall myocardial infarction, with small area of reversibility in the distal lateral wall suspicious for peri-infarct ischemia. 2. Inferior and septal wall hypokinesis. 3. Left ventricular ejection fraction 44% 4. Non invasive risk stratification*: Intermediate *2012 Appropriate Use Criteria for Coronary Revascularization Focused Update: J Am Coll Cardiol. 2012;59(9):857-881. http://content.dementiazones.com.aspx?articleid=[email or web address] Electronically Signed   By: Myles Rosenthal M.D.   On: 04/15/2016 14:40    Time Spent in minutes  30   SINGH,PRASHANT K M.D on 04/15/2016 at 2:54 PM  Between 7am to 7pm - Pager - [phone]  After 7pm go to [email or web address] - password [facility]  [facility] -  Office  [phone]

## 2016-04-15 NOTE — Progress Notes (Signed)
Lexiscan myoview completed without complications.  Nuc results to follow. 

## 2016-04-15 NOTE — Progress Notes (Signed)
Reviewed stress test results and discussed with Dr. Thedore Mins. The myoview demonstrates a moderate to large perfusion defect in the inferior wall which is predominantly fixed, but there is some reversibility. LVEF 44% with inferior and septal hypokinesis. This agrees with my interpretation of his LVEF by echo. Based on these findings, left heart catheterization is recommended. Please keep NPO p MN for possible LHC tomorrow at [facility]. Cath orders have been placed.  Chrystie Nose, MD, [facility] Attending Cardiologist [facility]

## 2016-04-15 NOTE — Consult Note (Signed)
CONSULTATION NOTE  Reason for Consult: NSVT, new cardiomyopathy     Requesting Physician: Dr. Candiss Norse    Cardiologist: None (NEW)  HPI: This is a 55 y.o. male with a past medical history significant for hypertension with medication noncompliance. He reports 2 weeks of progressive chest tightness, but denied cough or shortness of breath. On admission, blood pressure is 183/117. Troponin was noted to be elevated 0.08 and rose to 0.22. Creatinine is mildly elevated at 1.25.he was treated for hypertensive urgency and placed on amlodipine 10 mg daily. Yesterday he had a 20 beat run of asymptomatic V. Tach. He underwent an echocardiogram which demonstrated a newly reduced LVEF to 30-35%. There is diffuse hypokinesis although regional wall motion abnormality is cannot be excluded. Note: I personally reviewed his echocardiogram and believe his EF is actually higher than 35%, but is decreased likely more in the 40-45% range. Blood pressure now appears to be much better controlled this morning at 117/65.medications were adjusted and currently he is on BiDil20/37.5 mg 3 times a day and carvedilol 6.25 twice a day. Cardiology is asked to consult regarding an SVT and new cardiomyopathy. Of note there was no further NSVT noted overnight, although there was lead artifact on telemetry monitoring which was treated and irregularly irregular tachycardia with rates about 250 which was clearly artifact.   PMHx:  Past Medical History  Diagnosis Date  . Hypertension    History reviewed. No pertinent past surgical history.  FAMHx: Family History  Problem Relation Age of Onset  . Heart attack Mother   . Heart disease Father   . Coronary artery disease Sister     SOCHx:  reports that he has never smoked. He does not have any smokeless tobacco history on file. He reports that he drinks alcohol. He reports that he does not use illicit drugs.  ALLERGIES: No Known Allergies  ROS: Pertinent items noted in  HPI and remainder of comprehensive ROS otherwise negative.  HOME MEDICATIONS:   Medication List    ASK your doctor about these medications        OVER THE COUNTER MEDICATION  Take 1 capsule by mouth daily. Nugenix Natural Testosterone Booster        HOSPITAL MEDICATIONS: I have reviewed the patient's current medications.  VITALS: Blood pressure 117/65, pulse 57, temperature 98.5 F (36.9 C), temperature source Oral, resp. rate 16, height 6' (1.829 m), weight 168 lb 14.4 oz (76.613 kg), SpO2 99 %.  PHYSICAL EXAM: General appearance: alert, no distress and thin Neck: no carotid bruit and no JVD Lungs: clear to auscultation bilaterally Heart: regular rate and rhythm, S1, S2 normal, no murmur, click, rub or gallop Abdomen: soft, non-tender; bowel sounds normal; no masses,  no organomegaly Extremities: extremities normal, atraumatic, no cyanosis or edema Pulses: 2+ and symmetric Skin: Skin color, texture, turgor normal. No rashes or lesions Neurologic: Grossly normal Psych: Pleasant  LABS: Results for orders placed or performed during the hospital encounter of 04/13/16 (from the past 48 hour(s))  Basic metabolic panel     Status: Abnormal   Collection Time: 04/13/16  8:50 PM  Result Value Ref Range   Sodium 139 135 - 145 mmol/L   Potassium 4.3 3.5 - 5.1 mmol/L   Chloride 106 101 - 111 mmol/L   CO2 27 22 - 32 mmol/L   Glucose, Bld 97 65 - 99 mg/dL   BUN 17 6 - 20 mg/dL   Creatinine, Ser 1.25 (H) 0.61 - 1.24 mg/dL  Calcium 9.2 8.9 - 10.3 mg/dL   GFR calc non Af Amer >60 >60 mL/min   GFR calc Af Amer >60 >60 mL/min    Comment: (NOTE) The eGFR has been calculated using the CKD EPI equation. This calculation has not been validated in all clinical situations. eGFR's persistently <60 mL/min signify possible Chronic Kidney Disease.    Anion gap 6 5 - 15  CBC     Status: None   Collection Time: 04/13/16  8:50 PM  Result Value Ref Range   WBC 4.0 4.0 - 10.5 K/uL   RBC  5.05 4.22 - 5.81 MIL/uL   Hemoglobin 14.9 13.0 - 17.0 g/dL   HCT 41.4 39.0 - 52.0 %   MCV 82.0 78.0 - 100.0 fL   MCH 29.5 26.0 - 34.0 pg   MCHC 36.0 30.0 - 36.0 g/dL   RDW 13.9 11.5 - 15.5 %   Platelets 178 150 - 400 K/uL  I-stat troponin, ED     Status: None   Collection Time: 04/13/16  8:59 PM  Result Value Ref Range   Troponin i, poc 0.08 0.00 - 0.08 ng/mL   Comment 3            Comment: Due to the release kinetics of cTnI, a negative result within the first hours of the onset of symptoms does not rule out myocardial infarction with certainty. If myocardial infarction is still suspected, repeat the test at appropriate intervals.   I-Stat Troponin, ED (not at [facility])     Status: Abnormal   Collection Time: 04/14/16 12:12 AM  Result Value Ref Range   Troponin i, poc 0.10 (HH) 0.00 - 0.08 ng/mL   Comment NOTIFIED PHYSICIAN    Comment 3            Comment: Due to the release kinetics of cTnI, a negative result within the first hours of the onset of symptoms does not rule out myocardial infarction with certainty. If myocardial infarction is still suspected, repeat the test at appropriate intervals.   Lipid panel     Status: None   Collection Time: 04/14/16  2:14 AM  Result Value Ref Range   Cholesterol 182 0 - 200 mg/dL   Triglycerides 65 <150 mg/dL   HDL 80 >40 mg/dL   Total CHOL/HDL Ratio 2.3 RATIO   VLDL 13 0 - 40 mg/dL   LDL Cholesterol 89 0 - 99 mg/dL    Comment:        Total Cholesterol/HDL:CHD Risk Coronary Heart Disease Risk Table                     Men   Women  1/2 Average Risk   3.4   3.3  Average Risk       5.0   4.4  2 X Average Risk   9.6   7.1  3 X Average Risk  23.4   11.0        Use the calculated Patient Ratio above and the CHD Risk Table to determine the patient's CHD Risk.        ATP III CLASSIFICATION (LDL):  <100     mg/dL   Optimal  100-129  mg/dL   Near or Above                    Optimal  130-159  mg/dL   Borderline  160-189  mg/dL    High  >190     mg/dL  Very High Performed at [facility]   Troponin I (q 6hr x 3)     Status: Abnormal   Collection Time: 04/14/16  2:14 AM  Result Value Ref Range   Troponin I 0.22 (HH) <0.03 ng/mL    Comment: CRITICAL RESULT CALLED TO, READ BACK BY AND VERIFIED WITH: DELELN,M RN [id number] COV[place],N   Brain natriuretic peptide     Status: None   Collection Time: 04/14/16  2:14 AM  Result Value Ref Range   B Natriuretic Peptide 40.8 0.0 - 100.0 pg/mL  Protime-INR     Status: None   Collection Time: 04/14/16  2:14 AM  Result Value Ref Range   Prothrombin Time 14.4 11.6 - 15.2 seconds   INR 1.15 0.00 - 1.49  APTT     Status: None   Collection Time: 04/14/16  2:14 AM  Result Value Ref Range   aPTT 30 24 - 37 seconds  Urine rapid drug screen (hosp performed)     Status: None   Collection Time: 04/14/16  2:43 AM  Result Value Ref Range   Opiates NONE DETECTED NONE DETECTED   Cocaine NONE DETECTED NONE DETECTED   Benzodiazepines NONE DETECTED NONE DETECTED   Amphetamines NONE DETECTED NONE DETECTED   Tetrahydrocannabinol NONE DETECTED NONE DETECTED   Barbiturates NONE DETECTED NONE DETECTED    Comment:        DRUG SCREEN FOR MEDICAL PURPOSES ONLY.  IF CONFIRMATION IS NEEDED FOR ANY PURPOSE, NOTIFY LAB WITHIN 5 DAYS.        LOWEST DETECTABLE LIMITS FOR URINE DRUG SCREEN Drug Class       Cutoff (ng/mL) Amphetamine      1000 Barbiturate      200 Benzodiazepine   242 Tricyclics       353 Opiates          300 Cocaine          300 THC              50   Creatinine, urine, random     Status: None   Collection Time: 04/14/16  2:43 AM  Result Value Ref Range   Creatinine, Urine 63.84 mg/dL    Comment: Performed at [facility]  Sodium, urine, random     Status: None   Collection Time: 04/14/16  2:43 AM  Result Value Ref Range   Sodium, Ur 136 mmol/L    Comment: Performed at [facility]  Troponin I (q 6hr x 3)     Status: Abnormal   Collection  Time: 04/14/16  7:08 AM  Result Value Ref Range   Troponin I 0.17 (HH) <0.03 ng/mL    Comment: CRITICAL VALUE NOTED.  VALUE IS CONSISTENT WITH PREVIOUSLY REPORTED AND CALLED VALUE.  Magnesium     Status: None   Collection Time: 04/14/16  7:45 AM  Result Value Ref Range   Magnesium 1.9 1.7 - 2.4 mg/dL  Basic metabolic panel     Status: Abnormal   Collection Time: 04/14/16  7:45 AM  Result Value Ref Range   Sodium 140 135 - 145 mmol/L   Potassium 3.3 (L) 3.5 - 5.1 mmol/L    Comment: DELTA CHECK NOTED   Chloride 108 101 - 111 mmol/L   CO2 26 22 - 32 mmol/L   Glucose, Bld 88 65 - 99 mg/dL   BUN 13 6 - 20 mg/dL   Creatinine, Ser 1.16 0.61 - 1.24 mg/dL   Calcium 8.8 (L) 8.9 -  10.3 mg/dL   GFR calc non Af Amer >60 >60 mL/min   GFR calc Af Amer >60 >60 mL/min    Comment: (NOTE) The eGFR has been calculated using the CKD EPI equation. This calculation has not been validated in all clinical situations. eGFR's persistently <60 mL/min signify possible Chronic Kidney Disease.    Anion gap 6 5 - 15  Troponin I     Status: Abnormal   Collection Time: 04/14/16 12:50 PM  Result Value Ref Range   Troponin I 0.16 (HH) <0.03 ng/mL    Comment: CRITICAL VALUE NOTED.  VALUE IS CONSISTENT WITH PREVIOUSLY REPORTED AND CALLED VALUE.  Magnesium     Status: None   Collection Time: 04/15/16  5:07 AM  Result Value Ref Range   Magnesium 2.1 1.7 - 2.4 mg/dL  Basic metabolic panel     Status: Abnormal   Collection Time: 04/15/16  5:07 AM  Result Value Ref Range   Sodium 140 135 - 145 mmol/L   Potassium 4.0 3.5 - 5.1 mmol/L    Comment: DELTA CHECK NOTED REPEATED TO VERIFY SLIGHT HEMOLYSIS    Chloride 111 101 - 111 mmol/L   CO2 24 22 - 32 mmol/L   Glucose, Bld 96 65 - 99 mg/dL   BUN 16 6 - 20 mg/dL   Creatinine, Ser 1.43 (H) 0.61 - 1.24 mg/dL   Calcium 9.2 8.9 - 10.3 mg/dL   GFR calc non Af Amer 54 (L) >60 mL/min   GFR calc Af Amer >60 >60 mL/min    Comment: (NOTE) The eGFR has been calculated  using the CKD EPI equation. This calculation has not been validated in all clinical situations. eGFR's persistently <60 mL/min signify possible Chronic Kidney Disease.    Anion gap 5 5 - 15    IMAGING: Dg Chest 2 View  04/13/2016  CLINICAL DATA:  55 year old male with chest pain and tightness EXAM: CHEST  2 VIEW COMPARISON:  Chest radiograph dated 02/17/2015 FINDINGS: Two views of the chest do not demonstrate a focal consolidation. There is minimal blunting of the right costophrenic angle scratched similar to prior study which may represent a small right pleural effusion versus scarring. There is no pneumothorax but stable for cardiac silhouette. No acute osseous pathology. IMPRESSION: No acute cardiopulmonary process. Electronically Signed   By: Anner Crete M.D.   On: 04/13/2016 20:45    HOSPITAL DIAGNOSES: Principal Problem:   Chest pain Active Problems:   Hypertensive urgency   Elevated troponin   AKI (acute kidney injury) ([place])   Chest tightness   NSTEMI (non-ST elevated myocardial infarction) (H[facility])   Pain in the chest   IMPRESSION: 1. 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 NSVT 3. Elevated troponin 4. Hypertensive emergency 5. Medication noncompliance 6. 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 Mr. Merica had nonsustained VT in the setting of  Malignant hypertension. He also had chest pain with mildly elevated troponins. This could be due to some cardiomyopathy, nonsustained VT or perhaps ischemia. Although his echo revealed an EF of 30-35%, I feel his LV function is actually closer to 45% without clear motion abnormalities. Nevertheless an ischemic workup is indicated. I would recommend a nuclear stress test today. Currently is asymptomatic and blood pressure is well controlled on adequate medications. He's had no further nonsustained VT. He appears euvolemic on exam. If his nuclear stress test is nonischemic, then he could be safely  discharged later today and I'm happy to see him in follow-up in the office.  Thanks for the consultation.  Time Spent Directly with Patient: 45 minutes  Pixie Casino, MD, [facility] Attending Cardiologist [place] 04/15/2016, 8:27 AM

## 2016-04-15 NOTE — Progress Notes (Signed)
Patient viewing cardiac cath video with wife.  Daniel Franklin. Clelia Croft, RN

## 2016-04-16 ENCOUNTER — Encounter (HOSPITAL_COMMUNITY)
Admission: EM | Disposition: A | Discharge status: Home / Self Care | Payer: Self-pay | Source: Home / Self Care | Attending: Internal Medicine

## 2016-04-16 DIAGNOSIS — R001 Bradycardia, unspecified: Secondary | ICD-10-CM

## 2016-04-16 HISTORY — PX: CARDIAC CATHETERIZATION: SHX172

## 2016-04-16 LAB — BASIC METABOLIC PANEL
Anion gap: 6 (ref 5–15)
BUN: 19 mg/dL (ref 6–20)
CHLORIDE: 109 mmol/L (ref 101–111)
CO2: 25 mmol/L (ref 22–32)
CREATININE: 1.61 mg/dL — AB (ref 0.61–1.24)
Calcium: 8.9 mg/dL (ref 8.9–10.3)
GFR calc Af Amer: 54 mL/min — ABNORMAL LOW (ref >60)
GFR calc non Af Amer: 47 mL/min — ABNORMAL LOW (ref >60)
Glucose, Bld: 116 mg/dL — ABNORMAL HIGH (ref 65–99)
POTASSIUM: 3.6 mmol/L (ref 3.5–5.1)
Sodium: 140 mmol/L (ref 135–145)

## 2016-04-16 LAB — HEMOGLOBIN A1C
Hgb A1c MFr Bld: 5.1 % (ref 4.8–5.6)
MEAN PLASMA GLUCOSE: 100 mg/dL

## 2016-04-16 LAB — CBC
HEMATOCRIT: 40.3 % (ref 39.0–52.0)
Hemoglobin: 13.5 g/dL (ref 13.0–17.0)
MCH: 29.2 pg (ref 26.0–34.0)
MCHC: 33.5 g/dL (ref 30.0–36.0)
MCV: 87.2 fL (ref 78.0–100.0)
Platelets: 184 10*3/uL (ref 150–400)
RBC: 4.62 MIL/uL (ref 4.22–5.81)
RDW: 14.8 % (ref 11.5–15.5)
WBC: 5.5 10*3/uL (ref 4.0–10.5)

## 2016-04-16 LAB — MAGNESIUM: Magnesium: 1.9 mg/dL (ref 1.7–2.4)

## 2016-04-16 SURGERY — LEFT HEART CATH AND CORONARY ANGIOGRAPHY
Anesthesia: LOCAL

## 2016-04-16 MED ORDER — FENTANYL CITRATE (PF) 100 MCG/2ML IJ SOLN
INTRAMUSCULAR | Status: DC | PRN
  Administered 2016-04-16 (×2): 50 ug via INTRAVENOUS

## 2016-04-16 MED ORDER — SODIUM CHLORIDE 0.9 % IV BOLUS (SEPSIS)
500.0000 mL | Freq: Once | INTRAVENOUS | Status: AC
  Administered 2016-04-16: 500 mL via INTRAVENOUS

## 2016-04-16 MED ORDER — MIDAZOLAM HCL 2 MG/2ML IJ SOLN
INTRAMUSCULAR | Status: AC
  Filled 2016-04-16: qty 2

## 2016-04-16 MED ORDER — HEPARIN (PORCINE) IN NACL 2-0.9 UNIT/ML-% IJ SOLN
INTRAMUSCULAR | Status: DC | PRN
  Administered 2016-04-16: 1500 mL

## 2016-04-16 MED ORDER — IOPAMIDOL (ISOVUE-370) INJECTION 76%
INTRAVENOUS | Status: DC | PRN
  Administered 2016-04-16: 70 mL via INTRA_ARTERIAL

## 2016-04-16 MED ORDER — CARVEDILOL 3.125 MG PO TABS
3.1250 mg | ORAL_TABLET | Freq: Once | ORAL | Status: AC
  Administered 2016-04-16: 3.125 mg via ORAL
  Filled 2016-04-16: qty 1

## 2016-04-16 MED ORDER — ONDANSETRON HCL 4 MG/2ML IJ SOLN: 4.0000 mg | Freq: Four times a day (QID) | INTRAMUSCULAR | Status: DC | PRN

## 2016-04-16 MED ORDER — LIDOCAINE HCL (PF) 1 % IJ SOLN
INTRAMUSCULAR | Status: DC | PRN
  Administered 2016-04-16: 2 mL via INTRADERMAL

## 2016-04-16 MED ORDER — HEPARIN SODIUM (PORCINE) 1000 UNIT/ML IJ SOLN
INTRAMUSCULAR | Status: AC
  Filled 2016-04-16: qty 1

## 2016-04-16 MED ORDER — HYDRALAZINE HCL 20 MG/ML IJ SOLN
INTRAMUSCULAR | Status: AC
  Filled 2016-04-16: qty 1

## 2016-04-16 MED ORDER — SODIUM CHLORIDE 0.9 % IV SOLN
INTRAVENOUS | Status: AC
  Administered 2016-04-16: 23:00:00 via INTRAVENOUS

## 2016-04-16 MED ORDER — HEPARIN (PORCINE) IN NACL 2-0.9 UNIT/ML-% IJ SOLN
INTRAMUSCULAR | Status: AC
  Filled 2016-04-16: qty 1500

## 2016-04-16 MED ORDER — VERAPAMIL HCL 2.5 MG/ML IV SOLN
INTRAVENOUS | Status: DC | PRN
  Administered 2016-04-16: 10 mL via INTRA_ARTERIAL

## 2016-04-16 MED ORDER — ACETAMINOPHEN 325 MG PO TABS: 650.0000 mg | ORAL_TABLET | ORAL | Status: DC | PRN

## 2016-04-16 MED ORDER — SODIUM CHLORIDE 0.9 % IV SOLN: INTRAVENOUS | Status: AC

## 2016-04-16 MED ORDER — FENTANYL CITRATE (PF) 100 MCG/2ML IJ SOLN
INTRAMUSCULAR | Status: AC
  Filled 2016-04-16: qty 2

## 2016-04-16 MED ORDER — SODIUM CHLORIDE 0.9% FLUSH: 3.0000 mL | INTRAVENOUS | Status: DC | PRN

## 2016-04-16 MED ORDER — OXYCODONE-ACETAMINOPHEN 5-325 MG PO TABS: 1.0000 | ORAL_TABLET | ORAL | Status: DC | PRN

## 2016-04-16 MED ORDER — HEPARIN SODIUM (PORCINE) 5000 UNIT/ML IJ SOLN
5000.0000 [IU] | Freq: Three times a day (TID) | INTRAMUSCULAR | Status: DC
  Administered 2016-04-16 – 2016-04-17 (×2): 5000 [IU] via SUBCUTANEOUS
  Filled 2016-04-16 (×2): qty 1

## 2016-04-16 MED ORDER — IOPAMIDOL (ISOVUE-370) INJECTION 76%
INTRAVENOUS | Status: AC
  Filled 2016-04-16: qty 100

## 2016-04-16 MED ORDER — SODIUM CHLORIDE 0.9 % IV SOLN: 250.0000 mL | INTRAVENOUS | Status: DC | PRN

## 2016-04-16 MED ORDER — SODIUM CHLORIDE 0.9% FLUSH
3.0000 mL | Freq: Two times a day (BID) | INTRAVENOUS | Status: DC
  Administered 2016-04-16: 3 mL via INTRAVENOUS

## 2016-04-16 MED ORDER — HYDRALAZINE HCL 20 MG/ML IJ SOLN
10.0000 mg | INTRAMUSCULAR | Status: DC | PRN
  Administered 2016-04-16: 10 mg via INTRAVENOUS

## 2016-04-16 MED ORDER — HEPARIN SODIUM (PORCINE) 1000 UNIT/ML IJ SOLN
INTRAMUSCULAR | Status: DC | PRN
  Administered 2016-04-16: 3700 [IU] via INTRAVENOUS

## 2016-04-16 MED ORDER — LIDOCAINE HCL (PF) 1 % IJ SOLN
INTRAMUSCULAR | Status: AC
  Filled 2016-04-16: qty 30

## 2016-04-16 MED ORDER — MIDAZOLAM HCL 2 MG/2ML IJ SOLN
INTRAMUSCULAR | Status: DC | PRN
  Administered 2016-04-16 (×2): 1 mg via INTRAVENOUS

## 2016-04-16 MED ORDER — VERAPAMIL HCL 2.5 MG/ML IV SOLN
INTRAVENOUS | Status: AC
  Filled 2016-04-16: qty 2

## 2016-04-16 SURGICAL SUPPLY — 9 items
CATH INFINITI 5 FR JL3.5 (CATHETERS) ×2 IMPLANT
CATH INFINITI JR4 5F (CATHETERS) ×2 IMPLANT
DEVICE RAD COMP TR BAND LRG (VASCULAR PRODUCTS) ×2 IMPLANT
GLIDESHEATH SLEND A-KIT 6F 22G (SHEATH) ×2 IMPLANT
KIT HEART LEFT (KITS) ×2 IMPLANT
PACK CARDIAC CATHETERIZATION (CUSTOM PROCEDURE TRAY) ×2 IMPLANT
TRANSDUCER W/STOPCOCK (MISCELLANEOUS) ×4 IMPLANT
TUBING CIL FLEX 10 FLL-RA (TUBING) ×2 IMPLANT
WIRE SAFE-T 1.5MM-J .035X260CM (WIRE) ×2 IMPLANT

## 2016-04-16 NOTE — Progress Notes (Signed)
PROGRESS NOTE                                                                                                                                                                                                             Patient Demographics:    Daniel Franklin, is a 55 y.o. male, DOB - 09-Dec-1960, ZOX:[id number]  Admit date - 04/13/2016   Admitting Physician Lorretta Harp, MD  Outpatient Primary MD for the patient is No primary care provider on file.  LOS -   Chief Complaint  Patient presents with  . Chest Pain    sent by UC       Brief Narrative    Daniel Franklin is a 55 y.o. male with medical history significant of hypertension, medication noncompliance, who presents with chest tightness.  Patient reports that he has been having chest tightness in the past 2 weeks, which has worsened today. His chest tightness is located substernal area, constant, 4 out of 10 in severity, nonradiating. It is exertional. Patient does not have cough or shortness of breath. He states that he did not take blood pressure medications for more than a year. Patient does not have nausea, vomiting, abdominal pain, symptoms of UTI or unilateral weakness. Denies drug use.  ED Course: pt was found to have an elevated blood pressure 183/117, elevated troponin 0.08--> 0.10-->0.22, WBC 4.0, temperature normal, bradycardia, AKI with Cre 1.25, negative chest x-ray for acute abnormalities. Patient is placed on telemetry bed for observation.     Subjective:    [name] has, No headache, No chest pain, No abdominal pain - No Nausea, No new weakness tingling or numbness, No Cough - SOB.     Assessment  & Plan :     1.Chest pain due to hypertensive crisis. Chest pain-free now, blood pressure medications adjusted with good control, troponin trend was flat and in non-ACS pattern, EKG non acute. Continue on aspirin, statin and beta blocker. Cardiology  following, Lexiscan +ve will need L[facility] later today, no further chest pain currently pain-free.  2. Hypertensive crisis. Due to noncompliance with blood pressure medications, medications have been adjusted. BP stable.  3. 20 beat asymptomatic run of V. Tach on 04-14-16. Replace potassium, given IV magnesium, placed on Coreg, EF 45%, Cards following.  4. ARF. Due to underlying hypertensive  nephropathy. He is likely used too high blood pressures, currently on Coreg will discontinue all other blood pressure medications, is being hydrated along with fluid bolus, monitor creatinine closely.  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   Family Communication  :  None present  Code Status :  Full  Diet : Heart Healthy  Disposition Plan  :  The inpatient on telemetry  Consults  :  Cards Dr. Rennis Golden   Procedures  :    TTE   Left ventricle: The cavity size was normal. Wall thickness was increased in a pattern of mild LVH. Systolic function was moderately to severely reduced. The estimated ejection fraction was in the range of 30% to 35%. Diffuse hypokinesis. Although no diagnostic regional wall motion abnormality was identified, this possibility cannot be completely excluded on the basis of this study. Doppler parameters are consistent with abnormal left ventricular relaxation (grade 1 diastolic dysfunction).  Lexiscan - Intermidiate +ve  Left heart catheterization on 04/16/2016 at [facility] -   DVT Prophylaxis  :    Heparin   Lab Results  Component Value Date   PLT 178 04/13/2016    Inpatient Medications  Scheduled Meds: . aspirin  324 mg Oral Daily  . atorvastatin  40 mg Oral q1800  . carvedilol  3.125 mg Oral BID WC  . heparin subcutaneous  5,000 Units Subcutaneous Q8H  . sodium chloride flush  3 mL Intravenous Q12H   Continuous Infusions: . sodium chloride    .  sodium chloride 75 mL/hr at 04/16/16 0909   PRN Meds:.sodium chloride, acetaminophen, ALPRAZolam, hydrALAZINE, morphine injection, nitroGLYCERIN, ondansetron (ZOFRAN) IV, sodium chloride flush  Antibiotics  :    Anti-infectives    None         Objective:   Filed Vitals:   04/15/16 1430 04/15/16 1816 04/15/16 2107 04/16/16 0528  BP: 98/60 123/66 134/84 104/53  Pulse: 53 60 76 58  Temp:   98.5 F (36.9 C) 97.4 F (36.3 C)  TempSrc:   Oral Oral  Resp:   18 18  Height:      Weight:      SpO2:   98% 96%    Wt Readings from Last 3 Encounters:  04/14/16 76.613 kg (168 lb 14.4 oz)  02/17/15 77.111 kg (170 lb)     Intake/Output Summary (Last 24 hours) at 04/16/16 0950 Last data filed at 04/15/16 2300  Gross per 24 hour  Intake 981.25 ml  Output      0 ml  Net 981.25 ml     Physical Exam  Awake Alert, Oriented X 3, No new F.N deficits, Normal affect [place].AT,PERRAL Supple Neck,No JVD, No cervical lymphadenopathy appriciated.  Symmetrical Chest wall movement, Good air movement bilaterally, CTAB RRR,No Gallops,Rubs or new Murmurs, No Parasternal Heave +ve B.Sounds, Abd Soft, No tenderness, No organomegaly appriciated, No rebound - guarding or rigidity. No Cyanosis, Clubbing or edema, No new Rash or bruise      Data Review:    CBC  Recent Labs Lab 04/13/16 2050  WBC 4.0  HGB 14.9  HCT 41.4  PLT 178  MCV 82.0  MCH 29.5  MCHC 36.0  RDW 13.9    Chemistries   Recent Labs Lab 04/13/16 2050 04/14/16 0745 04/15/16 0507 04/16/16 0508  NA 139 140 140 140  K 4.3 3.3* 4.0 3.6  CL 106 108 111 109  CO2 27  26 24 25   GLUCOSE 97 88 96 116*  BUN 17 13 16 19   CREATININE 1.25* 1.16 1.43* 1.61*  CALCIUM 9.2 8.8* 9.2 8.9  MG  --  1.9 2.1 1.9   ------------------------------------------------------------------------------------------------------------------  Recent Labs  04/14/16 0214  CHOL 182  HDL 80  LDLCALC 89  TRIG 65  CHOLHDL 2.3    Lab Results    Component Value Date   HGBA1C 5.1 04/14/2016   ------------------------------------------------------------------------------------------------------------------ No results for input(s): TSH, T4TOTAL, T3FREE, THYROIDAB in the last 72 hours.  Invalid input(s): FREET3 ------------------------------------------------------------------------------------------------------------------ No results for input(s): VITAMINB12, FOLATE, FERRITIN, TIBC, IRON, RETICCTPCT in the last 72 hours.  Coagulation profile  Recent Labs Lab 04/14/16 0214  INR 1.15    No results for input(s): DDIMER in the last 72 hours.  Cardiac Enzymes  Recent Labs Lab 04/14/16 0214 04/14/16 0708 04/14/16 1250  TROPONINI 0.22* 0.17* 0.16*   ------------------------------------------------------------------------------------------------------------------    Component Value Date/Time   BNP 40.8 04/14/2016 0214    Micro Results No results found for this or any previous visit (from the past 240 hour(s)).  Radiology Reports Dg Chest 2 View  04/13/2016  CLINICAL DATA:  55 year old male with chest pain and tightness EXAM: CHEST  2 VIEW COMPARISON:  Chest radiograph dated 02/17/2015 FINDINGS: Two views of the chest do not demonstrate a focal consolidation. There is minimal blunting of the right costophrenic angle scratched similar to prior study which may represent a small right pleural effusion versus scarring. There is no pneumothorax but stable for cardiac silhouette. No acute osseous pathology. IMPRESSION: No acute cardiopulmonary process. Electronically Signed   By: Elgie Collard M.D.   On: 04/13/2016 20:45   Nm Myocar Multi W/spect W/wall Motion / Ef  04/15/2016  CLINICAL DATA:  Chest pain.  Hypertension.  Cardiomyopathy. EXAM: MYOCARDIAL IMAGING WITH SPECT (REST AND PHARMACOLOGIC-STRESS) GATED LEFT VENTRICULAR WALL MOTION STUDY LEFT VENTRICULAR EJECTION FRACTION TECHNIQUE: Standard myocardial SPECT imaging was  performed after resting intravenous injection of 10 mCi Tc-79m tetrofosmin. Subsequently, intravenous infusion of Lexiscan was performed under the supervision of the Cardiology staff. At peak effect of the drug, 30 mCi Tc-87m tetrofosmin was injected intravenously and standard myocardial SPECT imaging was performed. Quantitative gated imaging was also performed to evaluate left ventricular wall motion, and estimate left ventricular ejection fraction. COMPARISON:  None. FINDINGS: Perfusion: Moderate to large perfusion defect seen in the inferior wall on stress and resting images, which is predominantly fixed. However there is a small area of reversibility seen in the distal lateral wall on resting images, suggesting reversible peri-infarct ischemia. Wall Motion: Mild inferior and septal wall hypokinesis noted. Mild left ventricular dilatation. Left Ventricular Ejection Fraction: 44 % End diastolic volume 158 ml End systolic volume 88 ml IMPRESSION: 1. Probable inferior wall myocardial infarction, with small area of reversibility in the distal lateral wall suspicious for peri-infarct ischemia. 2. Inferior and septal wall hypokinesis. 3. Left ventricular ejection fraction 44% 4. Non invasive risk stratification*: Intermediate *2012 Appropriate Use Criteria for Coronary Revascularization Focused Update: J Am Coll Cardiol. 2012;59(9):857-881. http://content.[email or web address]?articleid=[email or web address] Electronically Signed   By: Myles Rosenthal M.D.   On: 04/15/2016 14:40    Time Spent in minutes  30   Adali Pennings K M.D on 04/16/2016 at 9:50 AM  Between 7am to 7pm - Pager - [phone]  After 7pm go to [email or web address] - password [facility]  [facility] -  Office  [phone]

## 2016-04-16 NOTE — Progress Notes (Signed)
This morning, RN tried to stick patient for a second IV. Patient said he did not want a second one until closer to the time he has to leave for the cath. Also, patient does not want to sign the consent until the cardiologist talks to him about the procedure.

## 2016-04-16 NOTE — H&P (View-Only) (Signed)
Patient Name: Daniel Franklin Date of Encounter: 04/16/2016  Principal Problem:   Chest pain Active Problems:   Hypertensive urgency   Elevated troponin   AKI (acute kidney injury) (HCC)   Chest tightness   NSTEMI (non-ST elevated myocardial infarction) (HCC)   Pain in the chest   Primary Cardiologist: Dr Rennis Golden Patient Profile: 55 yo male w/ hx HTN, non-compliance w/ Rx, FH CAD, was admitted 06/30 w/ chest pain, Ez mildly elevated, echo w/ EF 30-35% (Dr Rennis Golden felt 40-45%)>>MV abnl>>cath planned 07/03  SUBJECTIVE: No more chest tightness, no SOB or palpitations.   OBJECTIVE Filed Vitals:   04/15/16 1430 04/15/16 1816 04/15/16 2107 04/16/16 0528  BP: 98/60 123/66 134/84 104/53  Pulse: 53 60 76 58  Temp:   98.5 F (36.9 C) 97.4 F (36.3 C)  TempSrc:   Oral Oral  Resp:   18 18  Height:      Weight:      SpO2:   98% 96%    Intake/Output Summary (Last 24 hours) at 04/16/16 0811 Last data filed at 04/15/16 2300  Gross per 24 hour  Intake 981.25 ml  Output      0 ml  Net 981.25 ml   Filed Weights   04/13/16 2030 04/14/16 0152  Weight: 170 lb (77.111 kg) 168 lb 14.4 oz (76.613 kg)    PHYSICAL EXAM General: Well developed, well nourished, male in no acute distress. Head: Normocephalic, atraumatic.  Neck: Supple without bruits, JVD not elevated. Lungs:  Resp regular and unlabored, CTA. Heart: RRR, S1, S2, no S3, S4, soft murmur; no rub. Abdomen: Soft, non-tender, non-distended, BS + x 4.  Extremities: No clubbing, cyanosis, edema.  Neuro: Alert and oriented X 3. Moves all extremities spontaneously. Psych: Normal affect.  LABS: CBC: Recent Labs  04/13/16 2050  WBC 4.0  HGB 14.9  HCT 41.4  MCV 82.0  PLT 178   INR: Recent Labs  04/14/16 0214  INR 1.15   Basic Metabolic Panel: Recent Labs  04/15/16 0507 04/16/16 0508  NA 140 140  K 4.0 3.6  CL 111 109  CO2 24 25  GLUCOSE 96 116*  BUN 16 19  CREATININE 1.43* 1.61*  CALCIUM 9.2 8.9  MG 2.1  1.9  No results found for: ALT, AST, GGT, ALKPHOS, BILITOT  Cardiac Enzymes: Recent Labs  04/14/16 0214 04/14/16 0708 04/14/16 1250  TROPONINI 0.22* 0.17* 0.16*    Recent Labs  04/13/16 2059 04/14/16 0012  TROPIPOC 0.08 0.10*   BNP:  B NATRIURETIC PEPTIDE  Date/Time Value Ref Range Status  04/14/2016 02:14 AM 40.8 0.0 - 100.0 pg/mL Final  02/17/2015 02:05 PM 43.2 0.0 - 100.0 pg/mL Final   Hemoglobin A1C: Recent Labs  04/14/16 0214  HGBA1C 5.1   Fasting Lipid Panel: Recent Labs  04/14/16 0214  CHOL 182  HDL 80  LDLCALC 89  TRIG 65  CHOLHDL 2.3   TELE:  SR, Sinus brady to high 40s, brief ST   Radiology/Studies: Nm Myocar Multi W/spect W/wall Motion / Ef 04/15/2016  CLINICAL DATA:  Chest pain.  Hypertension.  Cardiomyopathy. EXAM: MYOCARDIAL IMAGING WITH SPECT (REST AND PHARMACOLOGIC-STRESS) GATED LEFT VENTRICULAR WALL MOTION STUDY LEFT VENTRICULAR EJECTION FRACTION TECHNIQUE: Standard myocardial SPECT imaging was performed after resting intravenous injection of 10 mCi Tc-35m tetrofosmin. Subsequently, intravenous infusion of Lexiscan was performed under the supervision of the Cardiology staff. At peak effect of the drug, 30 mCi Tc-11m tetrofosmin was injected intravenously and standard myocardial SPECT imaging was  performed. Quantitative gated imaging was also performed to evaluate left ventricular wall motion, and estimate left ventricular ejection fraction. COMPARISON:  None. FINDINGS: Perfusion: Moderate to large perfusion defect seen in the inferior wall on stress and resting images, which is predominantly fixed. However there is a small area of reversibility seen in the distal lateral wall on resting images, suggesting reversible peri-infarct ischemia. Wall Motion: Mild inferior and septal wall hypokinesis noted. Mild left ventricular dilatation. Left Ventricular Ejection Fraction: 44 % End diastolic volume 158 ml End systolic volume 88 ml IMPRESSION: 1. Probable inferior  wall myocardial infarction, with small area of reversibility in the distal lateral wall suspicious for peri-infarct ischemia. 2. Inferior and septal wall hypokinesis. 3. Left ventricular ejection fraction 44% 4. Non invasive risk stratification*: Intermediate *2012 Appropriate Use Criteria for Coronary Revascularization Focused Update: J Am Coll Cardiol. 2012;59(9):857-881. http://content.[email or web address].aspx?articleid=[email or web address] Electronically Signed   By: Myles Rosenthal M.D.   On: 04/15/2016 14:40     Current Medications:  . aspirin  324 mg Oral Daily  . atorvastatin  40 mg Oral q1800  . carvedilol  3.125 mg Oral BID WC  . heparin subcutaneous  5,000 Units Subcutaneous Q8H  . sodium chloride  500 mL Intravenous Once  . sodium chloride flush  3 mL Intravenous Q12H   . sodium chloride    . sodium chloride 75 mL/hr at 04/16/16 0300    ASSESSMENT AND PLAN: Principal Problem:   Chest pain/elevated tropoinin/NSTEMI (non-ST elevated myocardial infarction) (HCC)/Chest tightness/Pain in the chest - ez mildly elevated, EF decreased by echo - MV was abnormal>>cath planned - he is on good medical therapy w/ ASA, statin, BB (however Coreg held this am due to low HR) - since he is on a statin, ck LFTs - The risks and benefits of a cardiac catheterization including, but not limited to, death, stroke, MI, kidney damage and bleeding were discussed with the patient who indicates understanding and agrees to proceed.     Bradycardia - he is sensitive to the Coreg - continue to follow with current parameters, may not be able to d/c him on this - MD advise on changing to bisoprolol  Active Problems:   Hypertensive urgency - improved, per IM    AKI (acute kidney injury) (HCC) -  BUN/Cr 17/1.25 on admit>>13/1.16>>16/1.43>>19/1.61 today - hydration is ordered, watch for volume overload - po intake poor since admit or not recorded.   Signed, Theodore Demark , PA-C 8:11 AM 04/16/2016      Personally seen and examined. Agree with above. Creat increasing, received bolus of IVF. Now on 75/hr No ACE-I BP soft following hypertensive urgency, only on coreg 3.125 BID now.  NSTEMI, EF 35%  On for cath today. Discussed renal function with him.   Donato Schultz, MD

## 2016-04-16 NOTE — Progress Notes (Signed)
Patient Name: Daniel Franklin Date of Encounter: 04/16/2016  Principal Problem:   Chest pain Active Problems:   Hypertensive urgency   Elevated troponin   AKI (acute kidney injury) (HCC)   Chest tightness   NSTEMI (non-ST elevated myocardial infarction) (HCC)   Pain in the chest   Primary Cardiologist: Dr Rennis Golden Patient Profile: 55 yo male w/ hx HTN, non-compliance w/ Rx, FH CAD, was admitted 06/30 w/ chest pain, Ez mildly elevated, echo w/ EF 30-35% (Dr Rennis Golden felt 40-45%)>>MV abnl>>cath planned 07/03  SUBJECTIVE: No more chest tightness, no SOB or palpitations.   OBJECTIVE Filed Vitals:   04/15/16 1430 04/15/16 1816 04/15/16 2107 04/16/16 0528  BP: 98/60 123/66 134/84 104/53  Pulse: 53 60 76 58  Temp:   98.5 F (36.9 C) 97.4 F (36.3 C)  TempSrc:   Oral Oral  Resp:   18 18  Height:      Weight:      SpO2:   98% 96%    Intake/Output Summary (Last 24 hours) at 04/16/16 0811 Last data filed at 04/15/16 2300  Gross per 24 hour  Intake 981.25 ml  Output      0 ml  Net 981.25 ml   Filed Weights   04/13/16 2030 04/14/16 0152  Weight: 170 lb (77.111 kg) 168 lb 14.4 oz (76.613 kg)    PHYSICAL EXAM General: Well developed, well nourished, male in no acute distress. Head: Normocephalic, atraumatic.  Neck: Supple without bruits, JVD not elevated. Lungs:  Resp regular and unlabored, CTA. Heart: RRR, S1, S2, no S3, S4, soft murmur; no rub. Abdomen: Soft, non-tender, non-distended, BS + x 4.  Extremities: No clubbing, cyanosis, edema.  Neuro: Alert and oriented X 3. Moves all extremities spontaneously. Psych: Normal affect.  LABS: CBC: Recent Labs  04/13/16 2050  WBC 4.0  HGB 14.9  HCT 41.4  MCV 82.0  PLT 178   INR: Recent Labs  04/14/16 0214  INR 1.15   Basic Metabolic Panel: Recent Labs  04/15/16 0507 04/16/16 0508  NA 140 140  K 4.0 3.6  CL 111 109  CO2 24 25  GLUCOSE 96 116*  BUN 16 19  CREATININE 1.43* 1.61*  CALCIUM 9.2 8.9  MG 2.1  1.9  No results found for: ALT, AST, GGT, ALKPHOS, BILITOT  Cardiac Enzymes: Recent Labs  04/14/16 0214 04/14/16 0708 04/14/16 1250  TROPONINI 0.22* 0.17* 0.16*    Recent Labs  04/13/16 2059 04/14/16 0012  TROPIPOC 0.08 0.10*   BNP:  B NATRIURETIC PEPTIDE  Date/Time Value Ref Range Status  04/14/2016 02:14 AM 40.8 0.0 - 100.0 pg/mL Final  02/17/2015 02:05 PM 43.2 0.0 - 100.0 pg/mL Final   Hemoglobin A1C: Recent Labs  04/14/16 0214  HGBA1C 5.1   Fasting Lipid Panel: Recent Labs  04/14/16 0214  CHOL 182  HDL 80  LDLCALC 89  TRIG 65  CHOLHDL 2.3   TELE:  SR, Sinus brady to high 40s, brief ST   Radiology/Studies: Nm Myocar Multi W/spect W/wall Motion / Ef 04/15/2016  CLINICAL DATA:  Chest pain.  Hypertension.  Cardiomyopathy. EXAM: MYOCARDIAL IMAGING WITH SPECT (REST AND PHARMACOLOGIC-STRESS) GATED LEFT VENTRICULAR WALL MOTION STUDY LEFT VENTRICULAR EJECTION FRACTION TECHNIQUE: Standard myocardial SPECT imaging was performed after resting intravenous injection of 10 mCi Tc-35m tetrofosmin. Subsequently, intravenous infusion of Lexiscan was performed under the supervision of the Cardiology staff. At peak effect of the drug, 30 mCi Tc-11m tetrofosmin was injected intravenously and standard myocardial SPECT imaging was  performed. Quantitative gated imaging was also performed to evaluate left ventricular wall motion, and estimate left ventricular ejection fraction. COMPARISON:  None. FINDINGS: Perfusion: Moderate to large perfusion defect seen in the inferior wall on stress and resting images, which is predominantly fixed. However there is a small area of reversibility seen in the distal lateral wall on resting images, suggesting reversible peri-infarct ischemia. Wall Motion: Mild inferior and septal wall hypokinesis noted. Mild left ventricular dilatation. Left Ventricular Ejection Fraction: 44 % End diastolic volume 158 ml End systolic volume 88 ml IMPRESSION: 1. Probable inferior  wall myocardial infarction, with small area of reversibility in the distal lateral wall suspicious for peri-infarct ischemia. 2. Inferior and septal wall hypokinesis. 3. Left ventricular ejection fraction 44% 4. Non invasive risk stratification*: Intermediate *2012 Appropriate Use Criteria for Coronary Revascularization Focused Update: J Am Coll Cardiol. 2012;59(9):857-881. http://content.onlinejacc.org/article.aspx?articleid=[email or web address] Electronically Signed   By: John  Stahl M.D.   On: 04/15/2016 14:40     Current Medications:  . aspirin  324 mg Oral Daily  . atorvastatin  40 mg Oral q1800  . carvedilol  3.125 mg Oral BID WC  . heparin subcutaneous  5,000 Units Subcutaneous Q8H  . sodium chloride  500 mL Intravenous Once  . sodium chloride flush  3 mL Intravenous Q12H   . sodium chloride    . sodium chloride 75 mL/hr at 04/16/16 0300    ASSESSMENT AND PLAN: Principal Problem:   Chest pain/elevated tropoinin/NSTEMI (non-ST elevated myocardial infarction) (HCC)/Chest tightness/Pain in the chest - ez mildly elevated, EF decreased by echo - MV was abnormal>>cath planned - he is on good medical therapy w/ ASA, statin, BB (however Coreg held this am due to low HR) - since he is on a statin, ck LFTs - The risks and benefits of a cardiac catheterization including, but not limited to, death, stroke, MI, kidney damage and bleeding were discussed with the patient who indicates understanding and agrees to proceed.     Bradycardia - he is sensitive to the Coreg - continue to follow with current parameters, may not be able to d/c him on this - MD advise on changing to bisoprolol  Active Problems:   Hypertensive urgency - improved, per IM    AKI (acute kidney injury) (HCC) -  BUN/Cr 17/1.25 on admit>>13/1.16>>16/1.43>>19/1.61 today - hydration is ordered, watch for volume overload - po intake poor since admit or not recorded.   Signed, Barrett, Rhonda , PA-C 8:11 AM 04/16/2016      Personally seen and examined. Agree with above. Creat increasing, received bolus of IVF. Now on 75/hr No ACE-I BP soft following hypertensive urgency, only on coreg 3.125 BID now.  NSTEMI, EF 35%  On for cath today. Discussed renal function with him.   Dominyck Reser, MD   

## 2016-04-16 NOTE — Interval H&P Note (Signed)
Cath Lab Visit (complete for each Cath Lab visit)  Clinical Evaluation Leading to the Procedure:   ACS: Yes.    Non-ACS:    Anginal Classification: CCS III  Anti-ischemic medical therapy: Maximal Therapy (2 or more classes of medications)  Non-Invasive Test Results: Intermediate-risk stress test findings: cardiac mortality 1-3%/year  Prior CABG: No previous CABG      History and Physical Interval Note:  04/16/2016 4:13 PM  Daniel Franklin  has presented today for surgery, with the diagnosis of unstable angina  The various methods of treatment have been discussed with the patient and family. After consideration of risks, benefits and other options for treatment, the patient has consented to  Procedure(s): Left Heart Cath and Coronary Angiography (N/A) as a surgical intervention .  The patient's history has been reviewed, patient examined, no change in status, stable for surgery.  I have reviewed the patient's chart and labs.  Questions were answered to the patient's satisfaction.     Lyn Records III

## 2016-04-16 NOTE — Progress Notes (Signed)
TR BAND REMOVAL  LOCATION:    radial  DEFLATED PER PROTOCOL:     TIME BAND OFF / DRESSING APPLIED: 1935     SITE UPON ARRIVAL:    Level 0  SITE AFTER BAND REMOVAL:    Level 0  CIRCULATION SENSATION AND MOVEMENT:    Within Normal Limits :  COMMENTS:

## 2016-04-17 DIAGNOSIS — I429 Cardiomyopathy, unspecified: Secondary | ICD-10-CM | POA: Insufficient documentation

## 2016-04-17 LAB — BASIC METABOLIC PANEL WITH GFR
Anion gap: 6 (ref 5–15)
BUN: 11 mg/dL (ref 6–20)
CO2: 22 mmol/L (ref 22–32)
Calcium: 8.8 mg/dL — ABNORMAL LOW (ref 8.9–10.3)
Chloride: 113 mmol/L — ABNORMAL HIGH (ref 101–111)
Creatinine, Ser: 1.14 mg/dL (ref 0.61–1.24)
GFR calc Af Amer: >60 mL/min
GFR calc non Af Amer: >60 mL/min
Glucose, Bld: 97 mg/dL (ref 65–99)
Potassium: 3.7 mmol/L (ref 3.5–5.1)
Sodium: 141 mmol/L (ref 135–145)

## 2016-04-17 MED ORDER — ASPIRIN 81 MG PO TBEC: 81.0000 mg | DELAYED_RELEASE_TABLET | Freq: Every day | ORAL | Status: DC

## 2016-04-17 MED ORDER — ASPIRIN EC 81 MG PO TBEC
81.0000 mg | DELAYED_RELEASE_TABLET | Freq: Every day | ORAL | Status: DC
  Administered 2016-04-17: 81 mg via ORAL
  Filled 2016-04-17: qty 1

## 2016-04-17 MED ORDER — CARVEDILOL 3.125 MG PO TABS: 3.1250 mg | ORAL_TABLET | Freq: Two times a day (BID) | ORAL | Status: DC

## 2016-04-17 NOTE — Discharge Instructions (Signed)
Follow with Primary MD  in 7 days  ° °Get CBC, CMP, 2 view Chest X ray checked  by Primary MD or SNF MD in 5-7 days ( we routinely change or add medications that can affect your baseline labs and fluid status, therefore we recommend that you get the mentioned basic workup next visit with your PCP, your PCP may decide not to get them or add new tests based on their clinical decision) ° °Activity: As tolerated with Full fall precautions use walker/cane & assistance as needed ° °Disposition Home   ° °Diet:   Heart Healthy   ° °For Heart failure patients - Check your Weight same time everyday, if you gain over 2 pounds, or you develop in leg swelling, experience more shortness of breath or chest pain, call your Primary MD immediately. Follow Cardiac Low Salt Diet and 1.5 lit/day fluid restriction. ° °On your next visit with your primary care physician please Get Medicines reviewed and adjusted. ° °Please request your Prim.MD to go over all Hospital Tests and Procedure/Radiological results at the follow up, please get all Hospital records sent to your Prim MD by signing hospital release before you go home. ° °If you experience worsening of your admission symptoms, develop shortness of breath, life threatening emergency, suicidal or homicidal thoughts you must seek medical attention immediately by calling 911 or calling your MD immediately  if symptoms less severe. ° °You Must read complete instructions/literature along with all the possible adverse reactions/side effects for all the Medicines you take and that have been prescribed to you. Take any new Medicines after you have completely understood and accpet all the possible adverse reactions/side effects.  ° °Do not drive, operate heavy machinery, perform activities at heights, swimming or participation in water activities or provide baby sitting services if your were admitted for syncope or siezures until you have seen by Primary MD or a Neurologist and advised to do  so again. ° °Do not drive when taking Pain medications.  ° ° °Do not take more than prescribed Pain, Sleep and Anxiety Medications ° °Special Instructions: If you have smoked or chewed Tobacco  in the last 2 yrs please stop smoking, stop any regular Alcohol  and or any Recreational drug use. ° °Wear Seat belts while driving. ° ° °Please note ° °You were cared for by a hospitalist during your hospital stay. If you have any questions about your discharge medications or the care you received while you were in the hospital after you are discharged, you can call the unit and asked to speak with the hospitalist on call if the hospitalist that took care of you is not available. Once you are discharged, your primary care physician will handle any further medical issues. Please note that NO REFILLS for any discharge medications will be authorized once you are discharged, as it is imperative that you return to your primary care physician (or establish a relationship with a primary care physician if you do not have one) for your aftercare needs so that they can reassess your need for medications and monitor your lab values. ° °

## 2016-04-17 NOTE — Progress Notes (Signed)
Patient ID: GIBBS NAUGLE, male   DOB: [birth year], 55 y.o.   MRN: [id number]     Subjective:    No complaints this AM  Objective:   Temp:  [98.2 F (36.8 C)-98.4 F (36.9 C)] 98.4 F (36.9 C) (07/04 0503) Pulse Rate:  [0-86] 86 (07/04 0503) Resp:  [0-20] 12 (07/04 0503) BP: (130-196)/(80-121) 130/80 mmHg (07/04 0503) SpO2:  [0 %-100 %] 98 % (07/04 0503) Last BM Date: 04/16/16  Filed Weights   04/13/16 2030 04/14/16 0152  Weight: 170 lb (77.111 kg) 168 lb 14.4 oz (76.613 kg)    Intake/Output Summary (Last 24 hours) at 04/17/16 0742 Last data filed at 04/17/16 0600  Gross per 24 hour  Intake    825 ml  Output      0 ml  Net    825 ml    Telemetry: NSR  Exam:  General: NAD  HEENT: sclera clear, throat clear  Resp: CTAB  Cardiac: RRR, no m/r/g, no jvd  GI: abdomen soft, NT, ND  MSK: no LE edema  Neuro: no focal deficits  Psych: appropriate affect  Lab Results:  Basic Metabolic Panel:  Recent Labs Lab 04/14/16 0745 04/15/16 0507 04/16/16 0508  NA 140 140 140  K 3.3* 4.0 3.6  CL 108 111 109  CO2 GLUCOSE 88 96 116*  BUN CREATININE 1.16 1.43* 1.61*  CALCIUM 8.8* 9.2 8.9  MG 1.9 2.1 1.9    Liver Function Tests: No results for input(s): AST, ALT, ALKPHOS, BILITOT, PROT, ALBUMIN in the last 168 hours.  CBC:  Recent Labs Lab 04/13/16 2050 04/16/16 0508  WBC 4.0 5.5  HGB 14.9 13.5  HCT 41.4 40.3  MCV 82.0 87.2  PLT 178 184    Cardiac Enzymes:  Recent Labs Lab 04/14/16 0214 04/14/16 0708 04/14/16 1250  TROPONINI 0.22* 0.17* 0.16*    BNP: No results for input(s): PROBNP in the last 8760 hours.  Coagulation:  Recent Labs Lab 04/14/16 0214  INR 1.15    ECG:   Medications:   Scheduled Medications: . aspirin  324 mg Oral Daily  . atorvastatin  40 mg Oral q1800  . carvedilol  3.125 mg Oral BID WC  . heparin  5,000 Units Subcutaneous Q8H  . sodium chloride flush  3 mL Intravenous Q12H      Infusions: . sodium chloride 75 mL/hr at 04/16/16 2310     PRN Medications:  sodium chloride, acetaminophen, ALPRAZolam, hydrALAZINE, nitroGLYCERIN, ondansetron (ZOFRAN) IV, oxyCODONE-acetaminophen, sodium chloride flush     Assessment/Plan   1. Chest pain/elevated troponin - peak trop 0.22, trending down - 04/2016 echo showed LVEF 30-35%, diffuse hypokinesis, grade I diastolic dysfunction - 04/2016 cath with patent coronaries - elevated troponin likely secondary to severe HTN on admission. Probable hypertensive CM - continue current meds  2. Acute systolic HF - echo LVEF 30-35%, new diagnosis for patient. Cath with patent coronaries, probable HTN CM - medical therapy with coreg 3.125mg  bid. Labs pending today, Cr up to 1.6 yesterday, would hold on ACE/ARB/Entresto/aldactone. If persistent dysfunction at outpatient follow up consider hydral/nitrates - if Cr down today would start lisinopril  daily. Due to lack of insurance would not use entresto  3. Bradycardia - have not been able to titrate up coreg due to low heart rates at times - no symptoms, continue current dose of coreg   4. NSVT - noted early on admission, no recurrence on beta blocker.   5. HTN -  remains elevated. If Cr down today would start low dose ACE-I. If remains elevated would start norvasc 10mg  daily with plans to start ACE-I as outpatient.     Ok for discharge from cardiac standpoint.     Dina Rich, M.D.,

## 2016-04-17 NOTE — Discharge Summary (Signed)
Daniel Franklin ZOX:[id number] DOB: Jun 14, 1961 DOA: 04/13/2016  PCP: No primary care provider on file.  Admit date: 04/13/2016  Discharge date: 04/17/2016  Admitted From: Home   Disposition:  Home   Recommendations for Outpatient Follow-up:   Follow up with PCP in 1-2 weeks  PCP Please obtain BMP/CBC in one week, 2 view CXR (see Discharge instructions)   PCP Please follow up on the following pending results: None   Home Health: None   Equipment/Devices: None  Discharge Condition: Stable    CODE STATUS: Full   Diet Recommendation: Heart Healthy Consultations: Cards  Chief Complaint  Patient presents with  . Chest Pain    sent by UC     Brief history of present illness from the day of admission and additional interim summary    Daniel Franklin is a 55 y.o. male with medical history significant of hypertension, medication noncompliance, who presents with chest tightness.  Patient reports that he has been having chest tightness in the past 2 weeks, which has worsened today. His chest tightness is located substernal area, constant, 4 out of 10 in severity, nonradiating. It is exertional. Patient does not have cough or shortness of breath. He states that he did not take blood pressure medications for more than a year. Patient does not have nausea, vomiting, abdominal pain, symptoms of UTI or unilateral weakness. Denies drug use.  ED Course: pt was found to have an elevated blood pressure 183/117, elevated troponin 0.08--> 0.10-->0.22, WBC 4.0, temperature normal, bradycardia, AKI with Cre 1.25, negative chest x-ray for acute abnormalities. Patient is placed on telemetry bed for observation.      Hospital issues addressed    1.Chest pain due to hypertensive crisis. Chest pain-free now, blood pressure  medications adjusted with good control, troponin trend was flat and in non-ACS pattern, EKG non acute. 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 Will be placed on aspirin and beta blocker and discharged home. Symptom-free. Will follow with PCP and cardiology post discharge.   2. Hypertensive crisis. Due to noncompliance with blood pressure medications, medications have been adjusted. BP stable on Coreg.  3. 20 beat asymptomatic run of V. Tach on 04-14-16. Replace potassium, given IV magnesium, placed on Coreg, EF 35%, the patient workup as above.  4. ARF. Due to underlying hypertensive nephropathy. Also due to transient drop in blood pressure, was hydrated renal function now has stabilized, monitor creatinine closely.  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   Discharge diagnosis     Principal Problem:   Chest pain Active Problems:   Hypertensive urgency   Elevated troponin  AKI (acute kidney injury) (HCC)   Chest tightness   NSTEMI (non-ST elevated myocardial infarction) (HCC)   Pain in the chest   Cardiomyopathy [facility])    Discharge instructions    Discharge Instructions    Diet - low sodium heart healthy    Complete by:  As directed      Discharge instructions    Complete by:  As directed   Follow with Primary MD  in 7 days   Get CBC, CMP, 2 view Chest X ray checked  by Primary MD or SNF MD in 5-7 days ( we routinely change or add medications that can affect your baseline labs and fluid status, therefore we recommend  that you get the mentioned basic workup next visit with your PCP, your PCP may decide not to get them or add new tests based on their clinical decision)   Activity: As tolerated with Full fall precautions use walker/cane & assistance as needed   Disposition Home     Diet:   Heart Healthy  For Heart failure patients - Check your Weight same time everyday, if you gain over 2 pounds, or you develop in leg swelling, experience more shortness of breath or chest pain, call your Primary MD immediately. Follow Cardiac Low Salt Diet and 1.5 lit/day fluid restriction.   On your next visit with your primary care physician please Get Medicines reviewed and adjusted.   Please request your Prim.MD to go over all Hospital Tests and Procedure/Radiological results at the follow up, please get all Hospital records sent to your Prim MD by signing hospital release before you go home.   If you experience worsening of your admission symptoms, develop shortness of breath, life threatening emergency, suicidal or homicidal thoughts you must seek medical attention immediately by calling 911 or calling your MD immediately  if symptoms less severe.  You Must read complete instructions/literature along with all the possible adverse reactions/side effects for all the Medicines you take and that have been prescribed to you. Take any new Medicines after you have completely understood and accpet all the possible adverse reactions/side effects.   Do not drive, operate heavy machinery, perform activities at heights, swimming or participation in water activities or provide baby sitting services if your were admitted for syncope or siezures until you have seen by Primary MD or a Neurologist and advised to do so again.  Do not drive when taking Pain medications.    Do not take more than prescribed Pain, Sleep and Anxiety Medications  Special Instructions: If you have smoked or chewed Tobacco  in the last 2 yrs please stop  smoking, stop any regular Alcohol  and or any Recreational drug use.  Wear Seat belts while driving.   Please note  You were cared for by a hospitalist during your hospital stay. If you have any questions about your discharge medications or the care you received while you were in the hospital after you are discharged, you can call the unit and asked to speak with the hospitalist on call if the hospitalist that took care of you is not available. Once you are discharged, your primary care physician will handle any further medical issues. Please note that NO REFILLS for any discharge medications will be authorized once you are discharged, as it is imperative that you return to your primary care physician (or establish a relationship with a primary care physician if you do not have one) for your aftercare needs so that they can reassess your  need for medications and monitor your lab values.     Increase activity slowly    Complete by:  As directed            Discharge Medications     Medication List    TAKE these medications        aspirin 81 MG EC tablet  Take 1 tablet (81 mg total) by mouth daily.     carvedilol 3.125 MG tablet  Commonly known as:  COREG  Take 1 tablet (3.125 mg total) by mouth 2 (two) times daily with a meal.     OVER THE COUNTER MEDICATION  Take 1 capsule by mouth daily. Nugenix Natural Testosterone Booster        No Known Allergies      Follow-up Information    Follow up with [facility] In 1 week.   Why:  or PCP as discussed with Case Manager   Contact information:   [address] [place] [address] [phone]      Follow up with Chrystie Nose, MD In 2 weeks.   Specialty:  Cardiology   Why:  we will arrange for follow-up and contact you.   Contact information:   [address] [place] [address] [phone]       Major procedures and Radiology Reports - PLEASE review  detailed and final reports thoroughly  -      TTE   Left ventricle: The cavity size was normal. Wall thickness was increased in a pattern of mild LVH. Systolic function was moderately to severely reduced. The estimated ejection fraction was in the range of 30% to 35%. Diffuse hypokinesis. Although no diagnostic regional wall motion abnormality was identified, this possibility cannot be completely excluded on the basis of this study. Doppler parameters are consistent with abnormal left ventricular relaxation (grade 1 diastolic dysfunction).  Lexiscan - Intermidiate +ve  Left heart catheterization on 04/16/2016 at [facility] - by Dr. Verdis Prime. No significant blockages. Medical treatment recommended. Please review final report for all details.   Dg Chest 2 View  04/13/2016  CLINICAL DATA:  55 year old male with chest pain and tightness EXAM: CHEST  2 VIEW COMPARISON:  Chest radiograph dated 02/17/2015 FINDINGS: Two views of the chest do not demonstrate a focal consolidation. There is minimal blunting of the right costophrenic angle scratched similar to prior study which may represent a small right pleural effusion versus scarring. There is no pneumothorax but stable for cardiac silhouette. No acute osseous pathology. IMPRESSION: No acute cardiopulmonary process. Electronically Signed   By: Elgie Collard M.D.   On: 04/13/2016 20:45   Nm Myocar Multi W/spect W/wall Motion / Ef  04/15/2016  CLINICAL DATA:  Chest pain.  Hypertension.  Cardiomyopathy. EXAM: MYOCARDIAL IMAGING WITH SPECT (REST AND PHARMACOLOGIC-STRESS) GATED LEFT VENTRICULAR WALL MOTION STUDY LEFT VENTRICULAR EJECTION FRACTION TECHNIQUE: Standard myocardial SPECT imaging was performed after resting intravenous injection of 10 mCi Tc-54m tetrofosmin. Subsequently, intravenous infusion of Lexiscan was performed under the supervision of the Cardiology staff. At peak effect of the drug, 30 mCi Tc-47m tetrofosmin was injected intravenously and  standard myocardial SPECT imaging was performed. Quantitative gated imaging was also performed to evaluate left ventricular wall motion, and estimate left ventricular ejection fraction. COMPARISON:  None. FINDINGS: Perfusion: Moderate to large perfusion defect seen in the inferior wall on stress and resting images, which is predominantly fixed. However there is a small area of reversibility seen in the  distal lateral wall on resting images, suggesting reversible peri-infarct ischemia. Wall Motion: Mild inferior and septal wall hypokinesis noted. Mild left ventricular dilatation. Left Ventricular Ejection Fraction: 44 % End diastolic volume 158 ml End systolic volume 88 ml IMPRESSION: 1. Probable inferior wall myocardial infarction, with small area of reversibility in the distal lateral wall suspicious for peri-infarct ischemia. 2. Inferior and septal wall hypokinesis. 3. Left ventricular ejection fraction 44% 4. Non invasive risk stratification*: Intermediate *2012 Appropriate Use Criteria for Coronary Revascularization Focused Update: J Am Coll Cardiol. 2012;59(9):857-881. http://content.dementiazones.com.aspx?articleid=[email or web address] Electronically Signed   By: Myles Rosenthal M.D.   On: 04/15/2016 14:40    Micro Results      No results found for this or any previous visit (from the past 240 hour(s)).  Today   Subjective    Daniel Franklin today has no headache,no chest abdominal pain,no new weakness tingling or numbness, feels much better wants to go home today.    Objective   Blood pressure 130/80, pulse 86, temperature 98.4 F (36.9 C), temperature source Oral, resp. rate 12, height 6' (1.829 m), weight 76.613 kg (168 lb 14.4 oz), SpO2 98 %.   Intake/Output Summary (Last 24 hours) at 04/17/16 0847 Last data filed at 04/17/16 0600  Gross per 24 hour  Intake    825 ml  Output      0 ml  Net    825 ml    Exam Awake Alert, Oriented x 3, No new F.N deficits, Normal affect [place].AT,PERRAL Supple  Neck,No JVD, No cervical lymphadenopathy appriciated.  Symmetrical Chest wall movement, Good air movement bilaterally, CTAB RRR,No Gallops,Rubs or new Murmurs, No Parasternal Heave +ve B.Sounds, Abd Soft, Non tender, No organomegaly appriciated, No rebound -guarding or rigidity. No Cyanosis, Clubbing or edema, No new Rash or bruise   Data Review   CBC w Diff: Lab Results  Component Value Date   WBC 5.5 04/16/2016   HGB 13.5 04/16/2016   HCT 40.3 04/16/2016   PLT 184 04/16/2016    CMP: Lab Results  Component Value Date   NA 141 04/17/2016   K 3.7 04/17/2016   CL 113* 04/17/2016   CO2 22 04/17/2016   BUN 11 04/17/2016   CREATININE 1.14 04/17/2016  .   Total Time in preparing paper work, data evaluation and todays exam - 35 minutes  Leroy Sea M.D on 04/17/2016 at 8:47 AM  [facility]   Office  [phone]

## 2016-04-18 ENCOUNTER — Encounter (HOSPITAL_COMMUNITY): Payer: Self-pay | Admitting: Interventional Cardiology

## 2016-04-20 ENCOUNTER — Telehealth: Payer: Self-pay | Admitting: Internal Medicine

## 2016-04-20 NOTE — Telephone Encounter (Signed)
TCM Phone Call. appt is 04/30/16 at 8am w/  Norma Fredrickson at the [facility] .Marland Kitchen   Thanks

## 2016-04-20 NOTE — Telephone Encounter (Signed)
Patient notified of PharmD advice.

## 2016-04-20 NOTE — Telephone Encounter (Signed)
Patient contacted regarding discharge from [facility] on April 17, 2016.  Patient understands to follow up with provider Norma Fredrickson on 04/30/16 at 0800 at [facility]  -- address provided Patient understands discharge instructions? YES Patient understands medications and regiment? YES Patient understands to bring all medications to this visit? YES  Patient wants to know if he can take Nugenix (spelling?) - testosterone booster - with his current medications Deferred to clinical pharmacy staff for med review

## 2016-04-20 NOTE — Telephone Encounter (Signed)
It won't interfere with his current medications.  It's not ideal for heart patients, so have him review with Lawson Fiscal when he sees her.

## 2016-04-21 ENCOUNTER — Telehealth: Payer: Self-pay | Admitting: Physician Assistant

## 2016-04-21 NOTE — Telephone Encounter (Signed)
Patient called answering service - he has since developed recurrent CP and SOB similar to what prompted recent ER visit. H/o normal cors 04/16/16. No syncope or dizziness or palpitations reported. He was reading side effects of carvedilol and wants to know if this could be causing it. He has not been able to check his BP since he was discharged so we do not know if it is high or low at this time. I feel in order to adjust his medicines more information is needed first - I advised he seek care in the ER or urgent care for further evaluation so that if medication changes are needed, the provider has access to his vital signs. He verbalized understanding and plans to proceed. Theta Leaf PA-C

## 2016-04-23 ENCOUNTER — Encounter: Payer: Self-pay | Admitting: Nurse Practitioner

## 2016-04-30 ENCOUNTER — Ambulatory Visit (INDEPENDENT_AMBULATORY_CARE_PROVIDER_SITE_OTHER): Payer: Self-pay | Admitting: Nurse Practitioner

## 2016-04-30 ENCOUNTER — Encounter: Payer: Self-pay | Admitting: Nurse Practitioner

## 2016-04-30 VITALS — BP 128/90 | HR 64 | Ht 72.0 in | Wt 170.2 lb

## 2016-04-30 DIAGNOSIS — I519 Heart disease, unspecified: Secondary | ICD-10-CM

## 2016-04-30 DIAGNOSIS — Z9889 Other specified postprocedural states: Secondary | ICD-10-CM

## 2016-04-30 DIAGNOSIS — I10 Essential (primary) hypertension: Secondary | ICD-10-CM

## 2016-04-30 LAB — BASIC METABOLIC PANEL
BUN: 21 mg/dL (ref 7–25)
CO2: 25 mmol/L (ref 20–31)
Calcium: 9.1 mg/dL (ref 8.6–10.3)
Chloride: 105 mmol/L (ref 98–110)
Creat: 1.64 mg/dL — ABNORMAL HIGH (ref 0.70–1.33)
Glucose, Bld: 140 mg/dL — ABNORMAL HIGH (ref 65–99)
Potassium: 3.7 mmol/L (ref 3.5–5.3)
Sodium: 141 mmol/L (ref 135–146)

## 2016-04-30 MED ORDER — LISINOPRIL 2.5 MG PO TABS: 2.5000 mg | ORAL_TABLET | Freq: Every day | ORAL | Status: DC

## 2016-04-30 MED ORDER — CARVEDILOL 3.125 MG PO TABS: 3.1250 mg | ORAL_TABLET | Freq: Two times a day (BID) | ORAL | Status: DC

## 2016-04-30 NOTE — Progress Notes (Signed)
CARDIOLOGY OFFICE NOTE  Date:  04/30/2016    Bradly Chris Date of Birth: 04-Mar-1961 Medical Record #[id number]  PCP:  No PCP Per Patient  Cardiologist:  HILTY   Chief Complaint  Patient presents with  . Cardiomyopathy  . Hypertension    Post hospital visit - seen for Dr. Rennis Golden    History of Present Illness: EPHRAM KORNEGAY is a 55 y.o. male who presents today for what was to be a TOC/post hospital visit - however he is outside the time frame. Seen for Dr. Rennis Golden.   He has a past medical history significant for hypertension with medication noncompliance. He has been using testosterone supplements as well.   He recently presented with chest pain. Troponins mildly elevated. He had a 20 beat run of asymptomatic V. Tach noted during this admission. He underwent an echocardiogram which demonstrated a newly reduced LVEF to 30-35%. There was diffuse hypokinesis although regional wall motion abnormality is cannot be excluded. Note: Dr. Rennis Golden personally reviewed his echocardiogram and believe his EF is actually higher than 35%, but is decreased likely more in the 40-45% range.  Cardiology was asked to consult regarding an SVT and new cardiomyopathy. He did undergo cardiac cath following abnormal Myoview - this showed normal coronaries. He is to be treated for his HTN.   Comes back today. Here alone. Dong ok. No real problems noted. No chest pain and not short of breath. No palpitations. No swelling. BP is up and down but he really can't tell me the numbers and has not been keeping a diary. Says he has changed his diet considerably and using less salt. No eating out. Drinks alcohol on Fridays. Does not quantify. He wants to resume exercise - lifting weight - up to 200 pounds and wants to resume his testosterone supplements.   Past Medical History  Diagnosis Date  . Hypertension     Past Surgical History  Procedure Laterality Date  . Cardiac catheterization N/A 04/16/2016    Procedure:  Left Heart Cath and Coronary Angiography;  Surgeon: Lyn Records, MD;  Location: [facility] INVASIVE CV LAB;  Service: Cardiovascular;  Laterality: N/A;     Medications: Current Outpatient Prescriptions  Medication Sig Dispense Refill  . aspirin 81 MG EC tablet Take 1 tablet (81 mg total) by mouth daily. 30 tablet 0  . carvedilol (COREG) 3.125 MG tablet Take 1 tablet (3.125 mg total) by mouth 2 (two) times daily with a meal. 180 tablet 3  . lisinopril (PRINIVIL,ZESTRIL) 2.5 MG tablet Take 1 tablet (2.5 mg total) by mouth daily. 30 tablet 6   No current facility-administered medications for this visit.    Allergies: No Known Allergies  Social History: The patient  reports that he has never smoked. He does not have any smokeless tobacco history on file. He reports that he drinks alcohol. He reports that he does not use illicit drugs.   Family History: The patient's family history includes Coronary artery disease in his sister; Heart attack in his mother; Heart disease in his father.   Review of Systems: Please see the history of present illness.   Otherwise, the review of systems is positive for none.   All other systems are reviewed and negative.   Physical Exam: VS:  BP 128/90 mmHg  Pulse 64  Ht 6' (1.829 m)  Wt 170 lb 3.2 oz (77.202 kg)  BMI 23.08 kg/m2 .  BMI Body mass index is 23.08 kg/(m^2).  Wt Readings from Last  3 Encounters:  04/30/16 170 lb 3.2 oz (77.202 kg)  04/14/16 168 lb 14.4 oz (76.613 kg)  02/17/15 170 lb (77.111 kg)    General: Pleasant. Well developed, well nourished and in no acute distress.  HEENT: Normal. Neck: Supple, no JVD, carotid bruits, or masses noted.  Cardiac: Regular rate and rhythm. No murmurs, rubs, or gallops. No edema.  Respiratory:  Lungs are clear to auscultation bilaterally with normal work of breathing.  GI: Soft and nontender.  MS: No deformity or atrophy. Gait and ROM intact. Skin: Warm and dry. Color is normal.  Neuro:  Strength and  sensation are intact and no gross focal deficits noted.  Psych: Alert, appropriate and with normal affect. Right wrist cath site is ok.    LABORATORY DATA:  EKG:  EKG is not ordered today.  Lab Results  Component Value Date   WBC 5.5 04/16/2016   HGB 13.5 04/16/2016   HCT 40.3 04/16/2016   PLT 184 04/16/2016   GLUCOSE 97 04/17/2016   CHOL 182 04/14/2016   TRIG 65 04/14/2016   HDL 80 04/14/2016   LDLCALC 89 04/14/2016   NA 141 04/17/2016   K 3.7 04/17/2016   CL 113* 04/17/2016   CREATININE 1.14 04/17/2016   BUN 11 04/17/2016   CO2 22 04/17/2016   INR 1.15 04/14/2016   HGBA1C 5.1 04/14/2016    BNP (last 3 results)  Recent Labs  04/14/16 0214  BNP 40.8    ProBNP (last 3 results) No results for input(s): PROBNP in the last 8760 hours.   Other Studies Reviewed Today:  Echo Study Conclusions from 04/2016  - Left ventricle: The cavity size was normal. Wall thickness was  increased in a pattern of mild LVH. Systolic function was  moderately to severely reduced. The estimated ejection fraction  was in the range of 30% to 35%. Diffuse hypokinesis. Although no  diagnostic regional wall motion abnormality was identified, this  possibility cannot be completely excluded on the basis of this  study. Doppler parameters are consistent with abnormal left  ventricular relaxation (grade 1 diastolic dysfunction).   MYOVIEW IMPRESSION: 1. Probable inferior wall myocardial infarction, with small area of reversibility in the distal lateral wall suspicious for peri-infarct ischemia.  2. Inferior and septal wall hypokinesis.  3. Left ventricular ejection fraction 44%  4. Non invasive risk stratification*: Intermediate  *2012 Appropriate Use Criteria for Coronary Revascularization Focused Update: J Am Coll Cardiol. 2012;59(9):857-881. http://content.dementiazones.com.aspx?articleid=1201161   Electronically Signed  By: Myles Rosenthal M.D.  On:  04/15/2016 14:40   Procedures    Left Heart Cath and Coronary Angiography from 04/16/2016    Conclusion     Normal coronary arteries. Right dominant coronary anatomy.  Normal left ventricular end-diastolic pressure. LVEDP 18 mmHg.  Echo documented LVEF 30-35%.  RECOMMENDATIONS:   Suspect hypertensive heart disease and recommend aggressive blood pressure control and education concerning importance of compliance.  Follow-up basic metabolic panel in a.m.   Assessment/Plan: 1.Chest pain due to hypertensive crisis. Normal coronaries by cardiac cath with LV dysfunction. On aspirin and beta blocker. Symptoms have resolved.   2. Hypertensive crisis. Hx of noncompliance. Would try back on low dose ACE - Lisinopril 2.5 mg a day. Lab today.   3. 20 beat asymptomatic run of V. Tach on 04-14-16. On beta blocker therapy. Needs repeat BMET today  4. ARF. Due to underlying hypertensive nephropathy. Needs repeat lab today. Last lab was normal. Adding very low dose ACE today.   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 Try to add back his ACE at very low dose. Lab today and again on return.  Plan for repeat echo in 3 months after satisfactory titration of his medicines. Would continue with salt restriction. Would avoid the heavy weight lifting as well as the testosterone supplements for now. Restrict alcohol as well. Doubt we are going to be able to get him on more Coreg with HR in the low 60's now.  6. History of non compliance - explained the importance of taking his medicines. Needs to let us know if side effects occur.   Current medicines are reviewed with the patient today.  The patient does not have concerns regarding medicines other than what has been noted above.  The following changes have been made:  See above.  Labs/ tests ordered today include:    Orders Placed This Encounter  Procedures  . Basic metabolic panel     Disposition:   FU with Dr.  Rennis Golden and team in about 2 weeks with BMET on return.  Patient is agreeable to this plan and will call if any problems develop in the interim.   Signed: Rosalio Macadamia, RN, ANP-C 04/30/2016 8:31 AM  [facility] [address] [place], [place]  [address] Phone: [phone] Fax: [phone]

## 2016-04-30 NOTE — Patient Instructions (Addendum)
We will be checking the following labs today - BMEt   Medication Instructions:    Continue with your current medicines. BUT  I am adding Lisiopril 2.5 mg a day - take your first dose tonight and then one a day - this is at the drug store  I have refilled your Coreg today    Testing/Procedures To Be Arranged:  N/A  Follow-Up:   See Dr. Coy Saunas in 2 weeks    Other Special Instructions:   Restrict salt  Limit your alcohol  No heavy lifting  Would not resume the testosterone supplements  Keep a BP diary - check at various times    If you need a refill on your cardiac medications before your next appointment, please call your pharmacy.   Call the [facility] office at [phone] if you have any questions, problems or concerns.

## 2016-05-01 ENCOUNTER — Telehealth: Payer: Self-pay | Admitting: *Deleted

## 2016-05-01 DIAGNOSIS — R7989 Other specified abnormal findings of blood chemistry: Secondary | ICD-10-CM

## 2016-05-01 NOTE — Telephone Encounter (Signed)
-----   Message from Rosalio Macadamia, NP sent at 05/01/2016  7:42 AM EDT ----- Ok to report. Labs are stable but creatinine back up - he was started on ACE at his visit. He was to have repeat lab and OV in 2 weeks however I would like to recheck BMET on Monday.

## 2016-05-01 NOTE — Telephone Encounter (Signed)
Informed pt of lab results. Pt verbalized understanding and was in agreement with this plan. Labs scheduled for Monday, 7/24.

## 2016-05-07 ENCOUNTER — Other Ambulatory Visit: Payer: Self-pay | Admitting: *Deleted

## 2016-05-07 DIAGNOSIS — R7989 Other specified abnormal findings of blood chemistry: Secondary | ICD-10-CM

## 2016-05-07 LAB — BASIC METABOLIC PANEL
BUN: 14 mg/dL (ref 7–25)
CO2: 30 mmol/L (ref 20–31)
Calcium: 9.2 mg/dL (ref 8.6–10.3)
Chloride: 107 mmol/L (ref 98–110)
Creat: 1.46 mg/dL — ABNORMAL HIGH (ref 0.70–1.33)
Glucose, Bld: 69 mg/dL (ref 65–99)
Potassium: 4.2 mmol/L (ref 3.5–5.3)
Sodium: 142 mmol/L (ref 135–146)

## 2016-05-15 ENCOUNTER — Emergency Department (HOSPITAL_COMMUNITY)
Admission: EM | Admit: 2016-05-15 | Discharge: 2016-05-15 | Disposition: A | Discharge status: Home / Self Care | Payer: Self-pay | Attending: Dermatology | Admitting: Dermatology

## 2016-05-15 ENCOUNTER — Encounter (HOSPITAL_COMMUNITY): Payer: Self-pay | Admitting: Emergency Medicine

## 2016-05-15 ENCOUNTER — Emergency Department (HOSPITAL_COMMUNITY): Payer: Self-pay

## 2016-05-15 DIAGNOSIS — Z5321 Procedure and treatment not carried out due to patient leaving prior to being seen by health care provider: Secondary | ICD-10-CM | POA: Insufficient documentation

## 2016-05-15 DIAGNOSIS — R079 Chest pain, unspecified: Secondary | ICD-10-CM | POA: Insufficient documentation

## 2016-05-15 LAB — CBC
HEMATOCRIT: 43.6 % (ref 39.0–52.0)
HEMOGLOBIN: 15 g/dL (ref 13.0–17.0)
MCH: 29 pg (ref 26.0–34.0)
MCHC: 34.4 g/dL (ref 30.0–36.0)
MCV: 84.3 fL (ref 78.0–100.0)
Platelets: 164 10*3/uL (ref 150–400)
RBC: 5.17 MIL/uL (ref 4.22–5.81)
RDW: 13.4 % (ref 11.5–15.5)
WBC: 3.9 10*3/uL — ABNORMAL LOW (ref 4.0–10.5)

## 2016-05-15 LAB — BASIC METABOLIC PANEL
ANION GAP: 5 (ref 5–15)
BUN: 13 mg/dL (ref 6–20)
CO2: 28 mmol/L (ref 22–32)
Calcium: 9 mg/dL (ref 8.9–10.3)
Chloride: 107 mmol/L (ref 101–111)
Creatinine, Ser: 1.42 mg/dL — ABNORMAL HIGH (ref 0.61–1.24)
GFR calc Af Amer: >60 mL/min (ref >60)
GFR calc non Af Amer: 54 mL/min — ABNORMAL LOW (ref >60)
GLUCOSE: 102 mg/dL — AB (ref 65–99)
POTASSIUM: 4.2 mmol/L (ref 3.5–5.1)
Sodium: 140 mmol/L (ref 135–145)

## 2016-05-15 LAB — I-STAT TROPONIN, ED: Troponin i, poc: 0.01 ng/mL (ref 0.00–0.08)

## 2016-05-15 NOTE — ED Triage Notes (Signed)
Patient states chest pain started about hour ago on left side that is sharp. Patient also requesting blood pressure checked because was elevated earlier and took HTN meds this morning.

## 2016-05-15 NOTE — ED Notes (Signed)
Pt stated he had to go pick up his daughter and couldn't wait Advised pt to return if symptoms worsen or change

## 2016-05-23 ENCOUNTER — Ambulatory Visit (INDEPENDENT_AMBULATORY_CARE_PROVIDER_SITE_OTHER): Payer: Self-pay | Admitting: Internal Medicine

## 2016-05-23 ENCOUNTER — Encounter: Payer: Self-pay | Admitting: Internal Medicine

## 2016-05-23 VITALS — BP 159/105 | HR 57 | Ht 72.0 in | Wt 173.4 lb

## 2016-05-23 DIAGNOSIS — I110 Hypertensive heart disease with heart failure: Secondary | ICD-10-CM | POA: Insufficient documentation

## 2016-05-23 DIAGNOSIS — I5022 Chronic systolic (congestive) heart failure: Secondary | ICD-10-CM | POA: Insufficient documentation

## 2016-05-23 DIAGNOSIS — R7989 Other specified abnormal findings of blood chemistry: Secondary | ICD-10-CM

## 2016-05-23 DIAGNOSIS — R748 Abnormal levels of other serum enzymes: Secondary | ICD-10-CM

## 2016-05-23 MED ORDER — HYDRALAZINE HCL 25 MG PO TABS: 25.0000 mg | ORAL_TABLET | Freq: Two times a day (BID) | ORAL | 5 refills | Status: DC

## 2016-05-23 MED ORDER — VALSARTAN 80 MG PO TABS: 80.0000 mg | ORAL_TABLET | Freq: Every day | ORAL | 5 refills | Status: DC

## 2016-05-23 NOTE — Patient Instructions (Signed)
Your physician has recommended you make the following change in your medication..  1. STOP Lisinopril  2. START Valsartan 80mg  once daily 3. START Hydralazine 25mg  twice daily  Your physician recommends that you schedule a follow-up appointment in TWO WEEKS with clinical pharmacy staff for a blood pressure check  -- please bring your BP cuff & list of BP readings to this appointment  Your physician recommends that you schedule a follow-up appointment in ONE MONTH with Dr. Rennis Golden.

## 2016-05-23 NOTE — Progress Notes (Signed)
OFFICE NOTE  Chief Complaint:  Blood pressure has been high  Primary Care Physician: No PCP Per Patient  HPI:  Daniel Franklin is a 55 y.o. male with a past medical history significant for hypertension with medication noncompliance. He reports 2 weeks of progressive chest tightness, but denied cough or shortness of breath. On admission, blood pressure is 183/117. Troponin was noted to be elevated 0.08 and rose to 0.22. Creatinine is mildly elevated at 1.25.he was treated for hypertensive urgency and placed on amlodipine 10 mg daily. Yesterday he had a 20 beat run of asymptomatic V. Tach. He underwent an echocardiogram which demonstrated a newly reduced LVEF to 30-35%. There is diffuse hypokinesis although regional wall motion abnormality is cannot be excluded. Note: I personally reviewed his echocardiogram and believe his EF is actually higher than 35%, but is decreased likely more in the 40-45% range. Blood pressure now appears to be much better controlled this morning at 117/65. Medications were adjusted and currently he is on BiDil20/37.5 mg 3 times a day and carvedilol 6.25 twice a day. Cardiology is asked to consult regarding an SVT and new cardiomyopathy. Of note there was no further NSVT noted overnight, although there was lead artifact on telemetry monitoring which was treated and irregularly irregular tachycardia with rates about 250 which was clearly artifact. He underwent left heart catheterization which showed normal coronaries. After discharge he was not taking BiDil due to the cost of the medication. He was seen by Norma Fredrickson, NP, who started him on low-dose lisinopril. He's also on low-dose carvedilol. Heart rate is in the upper 50s and will not allow titration of this. Blood pressure remains suboptimally controlled. He was seen in the emergency department for uncontrolled hypertension on August 1 but left before being seen by the physician. Blood work showed stable creatinine of  1.4.  PMHx:  Past Medical History:  Diagnosis Date  . Hypertension     Past Surgical History:  Procedure Laterality Date  . CARDIAC CATHETERIZATION N/A 04/16/2016   Procedure: Left Heart Cath and Coronary Angiography;  Surgeon: Lyn Records, MD;  Location: [facility] INVASIVE CV LAB;  Service: Cardiovascular;  Laterality: N/A;    FAMHx:  Family History  Problem Relation Age of Onset  . Heart attack Mother   . Heart disease Father   . Coronary artery disease Sister     SOCHx:   reports that he has never smoked. He has never used smokeless tobacco. He reports that he drinks alcohol. He reports that he does not use drugs.  ALLERGIES:  No Known Allergies  ROS: Pertinent items noted in HPI and remainder of comprehensive ROS otherwise negative.  HOME MEDS: Current Outpatient Prescriptions  Medication Sig Dispense Refill  . aspirin 81 MG EC tablet Take 1 tablet (81 mg total) by mouth daily. 30 tablet 0  . carvedilol (COREG) 3.125 MG tablet Take 1 tablet (3.125 mg total) by mouth 2 (two) times daily with a meal. 180 tablet 3   No current facility-administered medications for this visit.     LABS/IMAGING: No results found for this or any previous visit (from the past 48 hour(s)). No results found.  WEIGHTS: Wt Readings from Last 3 Encounters:  05/23/16 173 lb 6.4 oz (78.7 kg)  05/15/16 170 lb (77.1 kg)  04/30/16 170 lb 3.2 oz (77.2 kg)    VITALS: BP (!) 159/105   Pulse (!) 57   Ht 6' (1.829 m)   Wt 173 lb 6.4 oz (78.7  kg)   BMI 23.52 kg/m   EXAM: General appearance: alert and no distress Lungs: clear to auscultation bilaterally Heart: regular rate and rhythm, S1, S2 normal, no murmur, click, rub or gallop Extremities: extremities normal, atraumatic, no cyanosis or edema Neurologic: Grossly normal  EKG: Deferred  ASSESSMENT: 1. 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 Hypertensive heart disease with heart failure 3. Elevated serum creatinine  PLAN: 1.   Mr.  Tata continues to have poorly controlled hypertension. He had good blood pressure control the hospital while on BiDil, however this medication was too expensive and he was ultimately put on low-dose lisinopril. Unfortunately he's had some cough with lisinopril therefore will switch his lisinopril over to valsartan 80 mg daily. Add hydralazine 25 mg twice daily. This may need to be increased for optimal blood pressure control. He should continue low-dose carvedilol. I will arrange follow-up in the hypertension clinic in 2 weeks and then follow-up afterwards with me in one month. He will likely need repeat metabolic profile somewhere in between.  Chrystie Nose, MD, [facility] Attending Cardiologist [facility]  Chrystie Nose 05/23/2016, 9:36 AM

## 2016-05-31 ENCOUNTER — Emergency Department (HOSPITAL_COMMUNITY)
Admission: EM | Admit: 2016-05-31 | Discharge: 2016-05-31 | Disposition: A | Discharge status: Home / Self Care | Payer: Self-pay | Attending: Emergency Medicine | Admitting: Emergency Medicine

## 2016-05-31 ENCOUNTER — Encounter (HOSPITAL_COMMUNITY): Payer: Self-pay | Admitting: Emergency Medicine

## 2016-05-31 DIAGNOSIS — Z7982 Long term (current) use of aspirin: Secondary | ICD-10-CM | POA: Insufficient documentation

## 2016-05-31 DIAGNOSIS — I10 Essential (primary) hypertension: Secondary | ICD-10-CM

## 2016-05-31 DIAGNOSIS — I110 Hypertensive heart disease with heart failure: Secondary | ICD-10-CM | POA: Insufficient documentation

## 2016-05-31 DIAGNOSIS — Z79899 Other long term (current) drug therapy: Secondary | ICD-10-CM | POA: Insufficient documentation

## 2016-05-31 DIAGNOSIS — I509 Heart failure, unspecified: Secondary | ICD-10-CM | POA: Insufficient documentation

## 2016-05-31 NOTE — ED Triage Notes (Addendum)
Pt reports his BP was elevated this am. BP 151/115. Pt has been seen by cardiology for this and had medications recently changed. Pt on 3 different BP medications. No HA. Having chronic CP that is the same as it usually is. Has been seen by cardiology in the same visit for this.

## 2016-05-31 NOTE — ED Notes (Signed)
PA at bedside.

## 2016-05-31 NOTE — ED Provider Notes (Signed)
[facility]-EMERGENCY DEPT Provider Note   CSN: [id number] Arrival date & time: 05/31/16  1227     History   Chief Complaint Chief Complaint  Patient presents with  . Hypertension    HPI Daniel Franklin is a 55 y.o. male.  HPI   55 year old male with history of hypertension currently on carvedilol, losartan, and hydralazine presenting to the ED with complaints of elevated blood pressure. Patient states he is currently trying to keep his blood pressure under control. Patient was seen by Dr. Rennis Golden on 08/09 for blood pressure control. He also had a recent cardiac catheterization which showed normal coronaries. Patient subsequently discharge with carvedilol 3.125 mg twice daily, valsartan 80 mg daily, and hydralazine 25 mg twice daily. Patient states he has been taking his medication as prescribed. However, blood pressure remains elevated. Today when he checked it, states that his blood pressure was 197/130 which concerns him. He denies having any severe headache, neck pain, chest pain, shortness of breath, focal numbness or weakness. did report occasional lightheadedness. He is scheduled to be seen again by his cardiologist Monday (4 days from today).  Patient states he is currently managing his diet, and avoid smoking or alcohol use.    Past Medical History:  Diagnosis Date  . Hypertension     Patient Active Problem List   Diagnosis Date Noted  . Chronic systolic (congestive) heart failure (HCC) 05/23/2016  . Hypertensive heart disease with heart failure (HCC) 05/23/2016  . Cardiomyopathy (HCC)   . Hypertensive urgency 04/14/2016  . Elevated serum creatinine 04/14/2016  . Chest pain 04/14/2016  . AKI (acute kidney injury) (HCC) 04/14/2016  . Chest tightness 04/14/2016  . NSTEMI (non-ST elevated myocardial infarction) (HCC) 04/14/2016  . Pain in the chest     Past Surgical History:  Procedure Laterality Date  . CARDIAC CATHETERIZATION N/A 04/16/2016   Procedure: Left Heart Cath and  Coronary Angiography;  Surgeon: Lyn Records, MD;  Location: [facility] INVASIVE CV LAB;  Service: Cardiovascular;  Laterality: N/A;       Home Medications    Prior to Admission medications   Medication Sig Start Date End Date Taking? Authorizing Provider  aspirin 81 MG chewable tablet Chew 81 mg by mouth every morning.   Yes Historical Provider, MD  BEE POLLEN PO Take 2 capsules by mouth 2 (two) times daily.   Yes Historical Provider, MD  carvedilol (COREG) 3.125 MG tablet Take 1 tablet (3.125 mg total) by mouth 2 (two) times daily with a meal. 04/30/16  Yes Rosalio Macadamia, NP  fluticasone (FLONASE) 50 MCG/ACT nasal spray Place 2 sprays into both nostrils daily.   Yes Historical Provider, MD  hydrALAZINE (APRESOLINE) 25 MG tablet Take 1 tablet (25 mg total) by mouth 2 (two) times daily. 05/23/16 08/21/16 Yes Chrystie Nose, MD  valsartan (DIOVAN) 80 MG tablet Take 1 tablet (80 mg total) by mouth daily. 05/23/16  Yes Chrystie Nose, MD  aspirin 81 MG EC tablet Take 1 tablet (81 mg total) by mouth daily. Patient not taking: Reported on 05/31/2016 04/17/16   Leroy Sea, MD    Family History Family History  Problem Relation Age of Onset  . Heart attack Mother   . Heart disease Father   . Coronary artery disease Sister     Social History Social History  Substance Use Topics  . Smoking status: Never Smoker  . Smokeless tobacco: Never Used  . Alcohol use Yes     Comment: 1-2 x a  week     Allergies   Lisinopril   Review of Systems Review of Systems  All other systems reviewed and are negative.    Physical Exam Updated Vital Signs BP (!) 151/115   Pulse 66   Temp 98.7 F (37.1 C) (Oral)   Resp 16   SpO2 95%   Physical Exam  Constitutional: He appears well-developed and well-nourished. No distress.  Patient sitting in the chair, appears to be in no acute discomfort, nontoxic in appearance  HENT:  Head: Atraumatic.  Eyes: Conjunctivae are normal.  Neck: Normal range of  motion. Neck supple. No JVD present.  Cardiovascular: Normal rate and regular rhythm.   Pulmonary/Chest: Effort normal and breath sounds normal. No respiratory distress. He exhibits no tenderness.  Abdominal: Soft. Bowel sounds are normal. There is no tenderness.  Musculoskeletal: He exhibits no edema or tenderness.  Neurological: He is alert.  Skin: No rash noted.  Psychiatric: He has a normal mood and affect.     ED Treatments / Results  Labs (all labs ordered are listed, but only abnormal results are displayed) Labs Reviewed - No data to display  EKG  EKG Interpretation None       Radiology No results found.  Procedures Procedures (including critical care time)  Medications Ordered in ED Medications - No data to display   Initial Impression / Assessment and Plan / ED Course  I have reviewed the triage vital signs and the nursing notes.  Pertinent labs & imaging results that were available during my care of the patient were reviewed by me and considered in my medical decision making (see chart for details).  Clinical Course    BP (!) 159/115 (BP Location: Right Arm)   Pulse 65   Temp 98.7 F (37.1 C) (Oral)   Resp 15   SpO2 99%    Final Clinical Impressions(s) / ED Diagnoses   Final diagnoses:  Essential hypertension    New Prescriptions New Prescriptions   No medications on file   2:23 PM Patient with history of hypertension currently on 3 separate the pressure medication here with concerns for elevated blood pressure. No significant complaint concerning for hypertensive emergency. He is well-appearing without any active chest pain or severe headache. His initial documented blood pressure today is 151 systolic.  2:38 PM His recheck blood pressure, patient has systolic blood pressure 159. He is stable to be discharged with close follow-up with his cardiologist in 4 days as previously scheduled. I encouraged patient to stay compliant with his  medication, monitoring his diet and avoid salt intake. Return precaution discussed. Patient stable for discharge.   Shant Hence, PA-C 05/31/16 1Fayrene Helper439    Raeford RazorStephen Kohut, MD 06/03/16 [phone]

## 2016-06-04 ENCOUNTER — Ambulatory Visit (INDEPENDENT_AMBULATORY_CARE_PROVIDER_SITE_OTHER): Payer: Self-pay | Admitting: Pharmacist Clinician (PhC)/ Clinical Pharmacy Specialist

## 2016-06-04 ENCOUNTER — Encounter: Payer: Self-pay | Admitting: Pharmacist Clinician (PhC)/ Clinical Pharmacy Specialist

## 2016-06-04 ENCOUNTER — Encounter (INDEPENDENT_AMBULATORY_CARE_PROVIDER_SITE_OTHER): Payer: Self-pay

## 2016-06-04 DIAGNOSIS — I110 Hypertensive heart disease with heart failure: Secondary | ICD-10-CM

## 2016-06-04 NOTE — Progress Notes (Signed)
     06/04/2016 AMEEN CHICO 1961/03/02 [id number]   HPI:  Daniel Franklin is a 55 y.o. male patient of Dr Rennis Golden, with a PMH below who presents today for hypertension clinic evaluation.  He reports feeling better today, but notes that his home BP has still been elevated for much of the past 2 weeks since seeing Dr. Rennis Golden.  Dr. Rennis Golden switched him from lisinopril 2.5 mg to valsartan 80 mg because of persistent cough, and also added hydralazine 25 mg bid.  Patient reports compliance with his current regimen.    Blood Pressure Goal:  140/90     Current Medications:  Valsartan 80 mg qam  Carvedilol 3.125 mg bid  Hydralazine 25 mg bid  Cardiac Hx:  SVT, cardiomyopathy, hypertension  Family Hx:  Mother died after MI, in her late 12's  Father and sister both with CAD  Social Hx:  No tobacco (quit > 20 years ago), no alcohol, rare caffeine  Diet:  Eats mostly home cooked meals, avoids salt and fried foods  Exercise:  Has BowFlex machine at home, uses 3 times per week  Home BP readings:  States all home readings still 160-180 systolic, unsure of diastolic readings.  Has new cuff, purchased in past month  Intolerances:   Lisinopril caused cough  Wt Readings from Last 3 Encounters:  06/04/16 171 lb (77.6 kg)  05/23/16 173 lb 6.4 oz (78.7 kg)  05/15/16 170 lb (77.1 kg)   BP Readings from Last 3 Encounters:  06/04/16 124/90  05/31/16 (!) 159/115  05/23/16 (!) 159/105   Pulse Readings from Last 3 Encounters:  06/04/16 64  05/31/16 65  05/23/16 (!) 57    Current Outpatient Prescriptions  Medication Sig Dispense Refill  . aspirin 81 MG chewable tablet Chew 81 mg by mouth every morning.    Marland Kitchen aspirin 81 MG EC tablet Take 1 tablet (81 mg total) by mouth daily. (Patient not taking: Reported on 05/31/2016) 30 tablet 0  . BEE POLLEN PO Take 2 capsules by mouth 2 (two) times daily.    . carvedilol (COREG) 3.125 MG tablet Take 1 tablet (3.125 mg total) by mouth 2 (two) times daily  with a meal. 180 tablet 3  . fluticasone (FLONASE) 50 MCG/ACT nasal spray Place 2 sprays into both nostrils daily.    . hydrALAZINE (APRESOLINE) 25 MG tablet Take 1 tablet (25 mg total) by mouth 2 (two) times daily. 60 tablet 5  . valsartan (DIOVAN) 80 MG tablet Take 1 tablet (80 mg total) by mouth daily. 30 tablet 5   No current facility-administered medications for this visit.     Allergies  Allergen Reactions  . Lisinopril Cough    Past Medical History:  Diagnosis Date  . Hypertension     Blood pressure 124/90, pulse 64, height 6' (1.829 m), weight 171 lb (77.6 kg).    Phillips Hay PharmD CPP [facility]

## 2016-06-04 NOTE — Assessment & Plan Note (Signed)
Despite his information about home BP readings, it looks good today at 124/90.  He did not bring his home cuff, so cannot confirm its accuracy.  Have asked that he continue with current medications, check his BP 1-2 times daily for the next 2 weeks, and bring his cuff and log to appt with Dr. Rennis Golden scheduled for 2 weeks from now.

## 2016-06-04 NOTE — Patient Instructions (Signed)
Return for a a follow up appointment in 2 weeks with Dr. Rennis Golden   Your blood pressure today is 124/90  (goal is < 140/90)  Check your blood pressure at home once to twice daily and keep record of the readings.  Take your BP meds as follows: continue all current medications  Bring all of your meds, your BP cuff and your record of home blood pressures to your next appointment.  Exercise as you're able, try to walk approximately 30 minutes per day.  Keep salt intake to a minimum, especially watch canned and prepared boxed foods.  Eat more fresh fruits and vegetables and fewer canned items.  Avoid eating in fast food restaurants.    HOW TO TAKE YOUR BLOOD PRESSURE: . Rest 5 minutes before taking your blood pressure. .  Don't smoke or drink caffeinated beverages for at least 30 minutes before. . Take your blood pressure before (not after) you eat. . Sit comfortably with your back supported and both feet on the floor (don't cross your legs). . Elevate your arm to heart level on a table or a desk. . Use the proper sized cuff. It should fit smoothly and snugly around your bare upper arm. There should be enough room to slip a fingertip under the cuff. The bottom edge of the cuff should be 1 inch above the crease of the elbow. . Ideally, take 3 measurements at one sitting and record the average.

## 2016-06-22 ENCOUNTER — Ambulatory Visit (INDEPENDENT_AMBULATORY_CARE_PROVIDER_SITE_OTHER): Payer: Self-pay | Admitting: Internal Medicine

## 2016-06-22 ENCOUNTER — Encounter: Payer: Self-pay | Admitting: Internal Medicine

## 2016-06-22 VITALS — BP 133/94 | HR 66 | Ht 72.0 in | Wt 172.0 lb

## 2016-06-22 DIAGNOSIS — R7989 Other specified abnormal findings of blood chemistry: Secondary | ICD-10-CM

## 2016-06-22 DIAGNOSIS — I5022 Chronic systolic (congestive) heart failure: Secondary | ICD-10-CM

## 2016-06-22 DIAGNOSIS — R748 Abnormal levels of other serum enzymes: Secondary | ICD-10-CM

## 2016-06-22 DIAGNOSIS — I110 Hypertensive heart disease with heart failure: Secondary | ICD-10-CM

## 2016-06-22 MED ORDER — VALSARTAN-HYDROCHLOROTHIAZIDE 80-12.5 MG PO TABS: 1.0000 | ORAL_TABLET | Freq: Every day | ORAL | 5 refills | Status: DC

## 2016-06-22 NOTE — Patient Instructions (Signed)
Your physician has recommended you make the following change in your medication..  1. STOP valsartan 2. START valsartan-hctz 80-12.5mg  once daily  Your physician recommends that you schedule a follow-up appointment in: 2-3 weeks with clinical pharmacist for a BP check   Your physician recommends that you schedule a follow-up appointment in: THREE MONTHS with Dr. Rennis Golden

## 2016-06-25 NOTE — Progress Notes (Signed)
OFFICE NOTE  Chief Complaint:  Blood pressure has been high  Primary Care Physician: No PCP Per Patient  HPI:  Daniel Franklin is a 55 y.o. male with a past medical history significant for hypertension with medication noncompliance. He reports 2 weeks of progressive chest tightness, but denied cough or shortness of breath. On admission, blood pressure is 183/117. Troponin was noted to be elevated 0.08 and rose to 0.22. Creatinine is mildly elevated at 1.25.he was treated for hypertensive urgency and placed on amlodipine 10 mg daily. Yesterday he had a 20 beat run of asymptomatic V. Tach. He underwent an echocardiogram which demonstrated a newly reduced LVEF to 30-35%. There is diffuse hypokinesis although regional wall motion abnormality is cannot be excluded. Note: I personally reviewed his echocardiogram and believe his EF is actually higher than 35%, but is decreased likely more in the 40-45% range. Blood pressure now appears to be much better controlled this morning at 117/65. Medications were adjusted and currently he is on BiDil20/37.5 mg 3 times a day and carvedilol 6.25 twice a day. Cardiology is asked to consult regarding an SVT and new cardiomyopathy. Of note there was no further NSVT noted overnight, although there was lead artifact on telemetry monitoring which was treated and irregularly irregular tachycardia with rates about 250 which was clearly artifact. He underwent left heart catheterization which showed normal coronaries. After discharge he was not taking BiDil due to the cost of the medication. He was seen by Norma Fredrickson, NP, who started him on low-dose lisinopril. He's also on low-dose carvedilol. Heart rate is in the upper 50s and will not allow titration of this. Blood pressure remains suboptimally controlled. He was seen in the emergency department for uncontrolled hypertension on August 1 but left before being seen by the physician. Blood work showed stable creatinine of  1.4.  06/22/2016  Daniel Franklin returns today for follow-up. In the interim, he was seen in the hypertension clinic and blood pressure appeared to be improved. Office BP was 124/90, however, his home readings were higher. He returns today with his home BP readings. We checked his bp with his cuff and ours and noted his cuff was 10-20 points higher for both systolic and diastolic readings. His arm cuff is about 55 years old. Blood pressure looks similar today to his recent hypertension clinic reading, with persistent elevated diastolic pressure.  PMHx:  Past Medical History:  Diagnosis Date  . Hypertension     Past Surgical History:  Procedure Laterality Date  . CARDIAC CATHETERIZATION N/A 04/16/2016   Procedure: Left Heart Cath and Coronary Angiography;  Surgeon: Lyn Records, MD;  Location: [facility] INVASIVE CV LAB;  Service: Cardiovascular;  Laterality: N/A;    FAMHx:  Family History  Problem Relation Age of Onset  . Heart attack Mother   . Heart disease Father   . Coronary artery disease Sister     SOCHx:   reports that he has never smoked. He has never used smokeless tobacco. He reports that he drinks alcohol. He reports that he does not use drugs.  ALLERGIES:  Allergies  Allergen Reactions  . Lisinopril Cough    ROS: Pertinent items noted in HPI and remainder of comprehensive ROS otherwise negative.  HOME MEDS: Current Outpatient Prescriptions  Medication Sig Dispense Refill  . aspirin 81 MG chewable tablet Chew 81 mg by mouth every morning.    Marland Kitchen aspirin 81 MG EC tablet Take 1 tablet (81 mg total) by mouth daily. 30  tablet 0  . BEE POLLEN PO Take 2 capsules by mouth 2 (two) times daily.    . carvedilol (COREG) 3.125 MG tablet Take 1 tablet (3.125 mg total) by mouth 2 (two) times daily with a meal. 180 tablet 3  . fluticasone (FLONASE) 50 MCG/ACT nasal spray Place 2 sprays into both nostrils daily.    . hydrALAZINE (APRESOLINE) 25 MG tablet Take 1 tablet (25 mg total) by mouth 2  (two) times daily. 60 tablet 5  . valsartan-hydrochlorothiazide (DIOVAN-HCT) 80-12.5 MG tablet Take 1 tablet by mouth daily. 30 tablet 5   No current facility-administered medications for this visit.     LABS/IMAGING: No results found for this or any previous visit (from the past 48 hour(s)). No results found.  WEIGHTS: Wt Readings from Last 3 Encounters:  06/22/16 172 lb (78 kg)  06/04/16 171 lb (77.6 kg)  05/23/16 173 lb 6.4 oz (78.7 kg)    VITALS: BP (!) 133/94   Pulse 66   Ht 6' (1.829 m)   Wt 172 lb (78 kg)   BMI 23.33 kg/m   EXAM: Deferred  EKG: Deferred  ASSESSMENT: 1. 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 Hypertensive heart disease with heart failure 3. Elevated serum creatinine  PLAN: 1.   Daniel Franklin has persistently elevated diastolic BP readings. He refuses to take "any more pills". He is agreeable to a combination medication. Will switch valsartan 80 mg daily to Valsartan HCT 80/25 mg daily. Follow-up in hypertension clinic in 2 weeks. Advised patient to obtain a new BP cuff and bring home readings with him.  Chrystie NoseKenneth C. Hilty, MD, [facility] Attending Cardiologist [facility]  Chrystie NoseKenneth C Hilty 06/25/2016, 3:14 PM

## 2016-07-12 ENCOUNTER — Encounter: Payer: Self-pay | Admitting: Pharmacist Clinician (PhC)/ Clinical Pharmacy Specialist

## 2016-07-12 ENCOUNTER — Ambulatory Visit (INDEPENDENT_AMBULATORY_CARE_PROVIDER_SITE_OTHER): Payer: Self-pay | Admitting: Pharmacist Clinician (PhC)/ Clinical Pharmacy Specialist

## 2016-07-12 DIAGNOSIS — I110 Hypertensive heart disease with heart failure: Secondary | ICD-10-CM

## 2016-07-12 NOTE — Patient Instructions (Signed)
Return for a a follow up appointment in 6-8 weeks  Your blood pressure today is 118/86  (goal is < 140/90)  Check your blood pressure at home daily and keep record of the readings.  Take your BP meds as follows: continue with all current medications  Bring all of your meds, your BP cuff and your record of home blood pressures to your next appointment.  Exercise as you're able, try to walk approximately 30 minutes per day.  Keep salt intake to a minimum, especially watch canned and prepared boxed foods.  Eat more fresh fruits and vegetables and fewer canned items.  Avoid eating in fast food restaurants.    HOW TO TAKE YOUR BLOOD PRESSURE: . Rest 5 minutes before taking your blood pressure. .  Don't smoke or drink caffeinated beverages for at least 30 minutes before. . Take your blood pressure before (not after) you eat. . Sit comfortably with your back supported and both feet on the floor (don't cross your legs). . Elevate your arm to heart level on a table or a desk. . Use the proper sized cuff. It should fit smoothly and snugly around your bare upper arm. There should be enough room to slip a fingertip under the cuff. The bottom edge of the cuff should be 1 inch above the crease of the elbow. . Ideally, take 3 measurements at one sitting and record the average.

## 2016-07-12 NOTE — Progress Notes (Signed)
     07/12/2016 FRED ADJEI [birth year] [id number]   HPI:  Daniel Franklin is a 55 y.o. male patient of Dr Rennis Golden, with a PMH below who presents today for hypertension clinic follow up.  He saw Dr. Rennis Golden earlier this month and was noted to have an elevated diastolic reading.  His valsartan 80 mg was changed to valsartan hctz 80/25 mg.  He reports no side effects or problems with the medication changes.  He did take his home BP cuff to that appointment and it was found to read 10-20 points higher both systolic and diastolic.  It was recommended that he get a new cuff, which he has not yet been able to do.    Blood Pressure Goal:  140/90     Current Medications:  Valsartan/hctz 80/25 mg qam  Carvedilol 3.125 mg bid  Hydralazine 25 mg bid  Cardiac Hx:  SVT, cardiomyopathy, hypertension  Family Hx:  Mother died after MI, in her late 56's  Father and sister both with CAD  Social Hx:  No tobacco (quit > 20 years ago), no alcohol, rare caffeine  Diet:  Eats mostly home cooked meals, avoids salt and fried foods  Exercise:  Has BowFlex machine at home, uses 3 times per week  Home BP readings:  Have improved with the addition of hctz.  Average reading was 127/84 with 32 readings.  Range was 102-162/59-113.    Intolerances:   Lisinopril caused cough  Wt Readings from Last 3 Encounters:  07/12/16 172 lb (78 kg)  06/22/16 172 lb (78 kg)  06/04/16 171 lb (77.6 kg)   BP Readings from Last 3 Encounters:  07/12/16 118/86  06/22/16 (!) 133/94  06/04/16 124/90   Pulse Readings from Last 3 Encounters:  07/12/16 64  06/22/16 66  06/04/16 64    Current Outpatient Prescriptions  Medication Sig Dispense Refill  . aspirin 81 MG chewable tablet Chew 81 mg by mouth every morning.    Marland Kitchen aspirin 81 MG EC tablet Take 1 tablet (81 mg total) by mouth daily. 30 tablet 0  . BEE POLLEN PO Take 2 capsules by mouth 2 (two) times daily.    . carvedilol (COREG) 3.125 MG tablet Take 1 tablet  (3.125 mg total) by mouth 2 (two) times daily with a meal. 180 tablet 3  . fluticasone (FLONASE) 50 MCG/ACT nasal spray Place 2 sprays into both nostrils daily.    . hydrALAZINE (APRESOLINE) 25 MG tablet Take 1 tablet (25 mg total) by mouth 2 (two) times daily. 60 tablet 5  . valsartan-hydrochlorothiazide (DIOVAN-HCT) 80-12.5 MG tablet Take 1 tablet by mouth daily. 30 tablet 5   No current facility-administered medications for this visit.     Allergies  Allergen Reactions  . Lisinopril Cough    Past Medical History:  Diagnosis Date  . Hypertension     Blood pressure 118/86, pulse 64, height 6' (1.829 m), weight 172 lb (78 kg).  Assessment/Plan: Blood pressure looking better in the office today.  Had a discussion about continued home monitoring, and for him to keep an eye on the diastolic readings.  Reviewed proper BP technique and will see him back in 6 weeks to be sure that he is still staying in goal range.   Phillips Hay PharmD CPP [facility]

## 2016-07-12 NOTE — Assessment & Plan Note (Signed)
Blood pressure looking better in the office today.  Had a discussion about continued home monitoring, and for him to keep an eye on the diastolic readings.  Reviewed proper BP technique and will see him back in 6 weeks to be sure that he is still staying in goal range.

## 2016-08-23 ENCOUNTER — Ambulatory Visit (INDEPENDENT_AMBULATORY_CARE_PROVIDER_SITE_OTHER): Payer: Self-pay | Admitting: Pharmacist Clinician (PhC)/ Clinical Pharmacy Specialist

## 2016-08-23 ENCOUNTER — Encounter: Payer: Self-pay | Admitting: Pharmacist Clinician (PhC)/ Clinical Pharmacy Specialist

## 2016-08-23 DIAGNOSIS — I110 Hypertensive heart disease with heart failure: Secondary | ICD-10-CM

## 2016-08-23 NOTE — Assessment & Plan Note (Signed)
Blood pressure continues to be at goal at this time.  He understands the need to continue with home monitoring, although he was assured that just once daily checks are fine.  He will continue with his current medications and knows to contact the office should his diastolic be >90 more than about 40-50% of the time.  We can follow up with him as needed.

## 2016-08-23 NOTE — Progress Notes (Signed)
     08/23/2016 Daniel Franklin 1961/06/13 [id number]   HPI:  Daniel Franklin is a 55 y.o. male patient of Dr Rennis Golden, with a PMH below who presents today for hypertension clinic follow up.  His history is indicative for primarily diastolic hypertension, with numbers ranging from 90-115.  While his systolic readings were at times elevated, they have responded easily to medication.  When I saw him last, we had his diastolic down to 86 in the office and he reported no concerns with his medications.   Blood Pressure Goal:  140/90     Current Medications:  Valsartan/hctz 80/25 mg qam  Carvedilol 3.125 mg bid  Hydralazine 25 mg bid  Cardiac Hx:  SVT, cardiomyopathy, hypertension  Family Hx:  Mother died after MI, in her late 70's  Father and sister both with CAD  Social Hx:  No tobacco (quit > 20 years ago), no alcohol, rare caffeine  Diet:  Eats mostly home cooked meals, avoids salt and fried foods  Exercise:  Has BowFlex machine at home, uses 3 times per week; also walks around his block 10 times per day (all at once), takes him about 90 minutes.  Home BP readings:  He checked his home pressure 70 times over last 35 days.  Range was 90-157/61-99.  Looking at just the first reading of the day for the past 32 days his average pressure was 125/84.  Intolerances:   Lisinopril caused cough  Wt Readings from Last 3 Encounters:  07/12/16 172 lb (78 kg)  06/22/16 172 lb (78 kg)  06/04/16 171 lb (77.6 kg)   BP Readings from Last 3 Encounters:  08/23/16 116/80  07/12/16 118/86  06/22/16 (!) 133/94   Pulse Readings from Last 3 Encounters:  08/23/16 76  07/12/16 64  06/22/16 66    Current Outpatient Prescriptions  Medication Sig Dispense Refill  . aspirin 81 MG chewable tablet Chew 81 mg by mouth every morning.    Marland Kitchen BEE POLLEN PO Take 2 capsules by mouth 2 (two) times daily.    . carvedilol (COREG) 3.125 MG tablet Take 1 tablet (3.125 mg total) by mouth 2 (two) times daily  with a meal. 180 tablet 3  . fluticasone (FLONASE) 50 MCG/ACT nasal spray Place 2 sprays into both nostrils daily.    . hydrALAZINE (APRESOLINE) 25 MG tablet Take 1 tablet (25 mg total) by mouth 2 (two) times daily. 60 tablet 5  . valsartan-hydrochlorothiazide (DIOVAN-HCT) 80-12.5 MG tablet Take 1 tablet by mouth daily. 30 tablet 5   No current facility-administered medications for this visit.     Allergies  Allergen Reactions  . Lisinopril Cough    Past Medical History:  Diagnosis Date  . Hypertension     Blood pressure 116/80, pulse 76.  Assessment/Plan:  Blood pressure continues to be at goal at this time.  He understands the need to continue with home monitoring, although he was assured that just once daily checks are fine.  He will continue with his current medications and knows to contact the office should his diastolic be >90 more than about 40-50% of the time.  We can follow up with him as needed.    Phillips Hay PharmD CPP [facility]

## 2016-08-23 NOTE — Patient Instructions (Signed)
Call if you notice the diastolic blood pressure (bottom number) > 90 more than about 40-50% of the time.  [phone]. (direct line to hypertension clinic)  Your blood pressure today is 116/80  (goal is < 140/90)  Check your blood pressure at home daily and keep record of the readings.  Take your BP meds as follows:  Continue with all current medications  Bring all of your meds, your BP cuff and your record of home blood pressures to your next appointment.  Exercise as you're able, try to walk approximately 30 minutes per day.  Keep salt intake to a minimum, especially watch canned and prepared boxed foods.  Eat more fresh fruits and vegetables and fewer canned items.  Avoid eating in fast food restaurants.    HOW TO TAKE YOUR BLOOD PRESSURE: . Rest 5 minutes before taking your blood pressure. .  Don't smoke or drink caffeinated beverages for at least 30 minutes before. . Take your blood pressure before (not after) you eat. . Sit comfortably with your back supported and both feet on the floor (don't cross your legs). . Elevate your arm to heart level on a table or a desk. . Use the proper sized cuff. It should fit smoothly and snugly around your bare upper arm. There should be enough room to slip a fingertip under the cuff. The bottom edge of the cuff should be 1 inch above the crease of the elbow. . Ideally, take 3 measurements at one sitting and record the average.

## 2016-09-04 ENCOUNTER — Telehealth: Payer: Self-pay | Admitting: Internal Medicine

## 2016-09-04 DIAGNOSIS — Z79899 Other long term (current) drug therapy: Secondary | ICD-10-CM

## 2016-09-04 MED ORDER — VALSARTAN-HYDROCHLOROTHIAZIDE 160-25 MG PO TABS: 1.0000 | ORAL_TABLET | Freq: Every day | ORAL | 5 refills | Status: DC

## 2016-09-04 NOTE — Telephone Encounter (Signed)
Pt notified he verbalizes direction and will go to the lab and keep appt in december

## 2016-09-04 NOTE — Telephone Encounter (Signed)
Have him increase valsartan/hctz from 80/12.5 to 160/25 (2 tabs) and send him for a BMET in 1 week.  Have him continue daily BP monitoring and bring his readings to his appt scheduled with Dr. Rennis Golden in December.

## 2016-09-04 NOTE — Telephone Encounter (Signed)
Spoke with pt states that he was told to call back if his BP was running high. 09-04-16 AM 118/92 HR 76  @314pm  165/108 HR 85 09-03-16 am 135/92 hr 77   PM 118/77 HR 62 09-02-16 AM 124/89 HR 72  PM 118/70 HR 61 09-01-16 AM 138/1002 HR 72   PM 128/99 HR 59 Please advise.

## 2016-09-04 NOTE — Telephone Encounter (Signed)
Daniel Franklin is calling because he was told to call the office back if his blood pressure keeps going up more than down . Please call   Thanks

## 2016-09-24 ENCOUNTER — Encounter: Payer: Self-pay | Admitting: Internal Medicine

## 2016-09-24 ENCOUNTER — Ambulatory Visit (INDEPENDENT_AMBULATORY_CARE_PROVIDER_SITE_OTHER): Payer: Self-pay | Admitting: Internal Medicine

## 2016-09-24 VITALS — BP 108/74 | HR 76 | Ht 72.0 in | Wt 178.2 lb

## 2016-09-24 DIAGNOSIS — I110 Hypertensive heart disease with heart failure: Secondary | ICD-10-CM

## 2016-09-24 DIAGNOSIS — I428 Other cardiomyopathies: Secondary | ICD-10-CM

## 2016-09-24 NOTE — Progress Notes (Signed)
OFFICE NOTE  Chief Complaint:  Blood pressure is improved  Primary Care Physician: No PCP Per Patient  HPI:  Daniel Franklin is a 55 y.o. male with a past medical history significant for hypertension with medication noncompliance. He reports 2 weeks of progressive chest tightness, but denied cough or shortness of breath. On admission, blood pressure is 183/117. Troponin was noted to be elevated 0.08 and rose to 0.22. Creatinine is mildly elevated at 1.25.he was treated for hypertensive urgency and placed on amlodipine 10 mg daily. Yesterday he had a 20 beat run of asymptomatic V. Tach. He underwent an echocardiogram which demonstrated a newly reduced LVEF to 30-35%. There is diffuse hypokinesis although regional wall motion abnormality is cannot be excluded. Note: I personally reviewed his echocardiogram and believe his EF is actually higher than 35%, but is decreased likely more in the 40-45% range. Blood pressure now appears to be much better controlled this morning at 117/65. Medications were adjusted and currently he is on BiDil20/37.5 mg 3 times a day and carvedilol 6.25 twice a day. Cardiology is asked to consult regarding an SVT and new cardiomyopathy. Of note there was no further NSVT noted overnight, although there was lead artifact on telemetry monitoring which was treated and irregularly irregular tachycardia with rates about 250 which was clearly artifact. He underwent left heart catheterization which showed normal coronaries. After discharge he was not taking BiDil due to the cost of the medication. He was seen by Norma Fredrickson, NP, who started him on low-dose lisinopril. He's also on low-dose carvedilol. Heart rate is in the upper 50s and will not allow titration of this. Blood pressure remains suboptimally controlled. He was seen in the emergency department for uncontrolled hypertension on August 1 but left before being seen by the physician. Blood work showed stable creatinine of  1.4.  06/22/2016  Daniel Franklin returns today for follow-up. In the interim, he was seen in the hypertension clinic and blood pressure appeared to be improved. Office BP was 124/90, however, his home readings were higher. He returns today with his home BP readings. We checked his bp with his cuff and ours and noted his cuff was 10-20 points higher for both systolic and diastolic readings. His arm cuff is about 55 years old. Blood pressure looks similar today to his recent hypertension clinic reading, with persistent elevated diastolic pressure.  09/24/2016  Daniel Franklin is seen today in follow-up. His blood pressures have been markedly improved. He brought a chart of them from November up until recently indicating systolic blood pressures between the high 90s to 120s systolic and diastolics between 50s and 80. He does feel a little dizzy occasionally with low blood pressure but then he starts exercising and blood pressure goes up accordingly. In general he's pleased with his current regimen.  PMHx:  Past Medical History:  Diagnosis Date  . Hypertension     Past Surgical History:  Procedure Laterality Date  . CARDIAC CATHETERIZATION N/A 04/16/2016   Procedure: Left Heart Cath and Coronary Angiography;  Surgeon: Lyn Records, MD;  Location: [facility] INVASIVE CV LAB;  Service: Cardiovascular;  Laterality: N/A;    FAMHx:  Family History  Problem Relation Age of Onset  . Heart attack Mother   . Heart disease Father   . Coronary artery disease Sister     SOCHx:   reports that he has never smoked. He has never used smokeless tobacco. He reports that he drinks alcohol. He reports that he does  not use drugs.  ALLERGIES:  Allergies  Allergen Reactions  . Lisinopril Cough    ROS: Pertinent items noted in HPI and remainder of comprehensive ROS otherwise negative.  HOME MEDS: Current Outpatient Prescriptions  Medication Sig Dispense Refill  . aspirin 81 MG chewable tablet Chew 81 mg by mouth every  morning.    Marland Kitchen. BEE POLLEN PO Take 2 capsules by mouth 2 (two) times daily.    . carvedilol (COREG) 3.125 MG tablet Take 1 tablet (3.125 mg total) by mouth 2 (two) times daily with a meal. 180 tablet 3  . fluticasone (FLONASE) 50 MCG/ACT nasal spray Place 2 sprays into both nostrils daily.    . valsartan-hydrochlorothiazide (DIOVAN HCT) 160-25 MG tablet Take 1 tablet by mouth daily. 30 tablet 5  . hydrALAZINE (APRESOLINE) 25 MG tablet Take 1 tablet (25 mg total) by mouth 2 (two) times daily. 60 tablet 5   No current facility-administered medications for this visit.     LABS/IMAGING: No results found for this or any previous visit (from the past 48 hour(s)). No results found.  WEIGHTS: Wt Readings from Last 3 Encounters:  09/24/16 178 lb 3.2 oz (80.8 kg)  07/12/16 172 lb (78 kg)  06/22/16 172 lb (78 kg)    VITALS: BP 108/74   Pulse 76   Ht 6' (1.829 m)   Wt 178 lb 3.2 oz (80.8 kg)   BMI 24.17 kg/m   EXAM: Deferred  EKG: Deferred  ASSESSMENT: 1. 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 Hypertensive heart disease with heart failure 3. Elevated serum creatinine  PLAN: 1.   Daniel Franklin Has had a nice improvement in his blood pressure on his current regimen. We'll continue that and it is adequate treatment for his heart failure on carvedilol and valsartan and HCTZ as well as hydralazine for afterload reduction. He will need reassessment of his LV function in July and will plan follow-up afterwards.   Chrystie NoseKenneth C. Darlena Koval, MD, [facility] Attending Cardiologist [facility]  Daniel Franklin 09/24/2016, 10:04 AM

## 2016-09-24 NOTE — Patient Instructions (Signed)
Your physician wants you to follow-up in: July 2018 or sooner if needed. You will receive a reminder letter in the mail two months in advance. If you don't receive a letter, please call our office to schedule the follow-up appointment.  Your physician has requested that you have an echocardiogram in July 2018. Echocardiography is a painless test that uses sound waves to create images of your heart. It provides your doctor with information about the size and shape of your heart and how well your heart's chambers and valves are working. This procedure takes approximately one hour. There are no restrictions for this procedure.   If you need a refill on your cardiac medications before your next appointment, please call your pharmacy.

## 2016-10-24 ENCOUNTER — Other Ambulatory Visit: Payer: Self-pay | Admitting: Internal Medicine

## 2016-10-24 NOTE — Telephone Encounter (Signed)
Rx(s) sent to pharmacy electronically.  

## 2017-02-09 ENCOUNTER — Other Ambulatory Visit: Payer: Self-pay | Admitting: Internal Medicine

## 2017-02-11 NOTE — Telephone Encounter (Signed)
REFILL 

## 2017-03-31 ENCOUNTER — Other Ambulatory Visit: Payer: Self-pay | Admitting: Nurse Practitioner

## 2017-04-05 ENCOUNTER — Encounter: Payer: Self-pay | Admitting: Internal Medicine

## 2017-04-05 ENCOUNTER — Ambulatory Visit (INDEPENDENT_AMBULATORY_CARE_PROVIDER_SITE_OTHER): Payer: BLUE CROSS/BLUE SHIELD | Admitting: Internal Medicine

## 2017-04-05 VITALS — BP 100/78 | HR 65 | Ht 72.0 in | Wt 172.0 lb

## 2017-04-05 DIAGNOSIS — I10 Essential (primary) hypertension: Secondary | ICD-10-CM | POA: Insufficient documentation

## 2017-04-05 DIAGNOSIS — I5022 Chronic systolic (congestive) heart failure: Secondary | ICD-10-CM

## 2017-04-05 DIAGNOSIS — I428 Other cardiomyopathies: Secondary | ICD-10-CM | POA: Diagnosis not present

## 2017-04-05 DIAGNOSIS — I110 Hypertensive heart disease with heart failure: Secondary | ICD-10-CM

## 2017-04-05 NOTE — Patient Instructions (Signed)
Your physician wants you to follow-up in: 1 year with Dr. Hilty. You will receive a reminder letter in the mail two months in advance. If you don't receive a letter, please call our office to schedule the follow-up appointment.  

## 2017-04-05 NOTE — Progress Notes (Signed)
OFFICE NOTE  Chief Complaint:  No complaints  Primary Care Physician: Patient, No Pcp Per  HPI:  Daniel Franklin is a 56 y.o. male with a past medical history significant for hypertension with medication noncompliance. He reports 2 weeks of progressive chest tightness, but denied cough or shortness of breath. On admission, blood pressure is 183/117. Troponin was noted to be elevated 0.08 and rose to 0.22. Creatinine is mildly elevated at 1.25.he was treated for hypertensive urgency and placed on amlodipine 10 mg daily. Yesterday he had a 20 beat run of asymptomatic V. Tach. He underwent an echocardiogram which demonstrated a newly reduced LVEF to 30-35%. There is diffuse hypokinesis although regional wall motion abnormality is cannot be excluded. Note: I personally reviewed his echocardiogram and believe his EF is actually higher than 35%, but is decreased likely more in the 40-45% range. Blood pressure now appears to be much better controlled this morning at 117/65. Medications were adjusted and currently he is on BiDil20/37.5 mg 3 times a day and carvedilol 6.25 twice a day. Cardiology is asked to consult regarding an SVT and new cardiomyopathy. Of note there was no further NSVT noted overnight, although there was lead artifact on telemetry monitoring which was treated and irregularly irregular tachycardia with rates about 250 which was clearly artifact. He underwent left heart catheterization which showed normal coronaries. After discharge he was not taking BiDil due to the cost of the medication. He was seen by Daniel Fredrickson, NP, who started him on low-dose lisinopril. He's also on low-dose carvedilol. Heart rate is in the upper 50s and will not allow titration of this. Blood pressure remains suboptimally controlled. He was seen in the emergency department for uncontrolled hypertension on August 1 but left before being seen by the physician. Blood work showed stable creatinine of  1.4.  06/22/2016  Daniel Franklin returns today for follow-up. In the interim, he was seen in the hypertension clinic and blood pressure appeared to be improved. Office BP was 124/90, however, his home readings were higher. He returns today with his home BP readings. We checked his bp with his cuff and ours and noted his cuff was 10-20 points higher for both systolic and diastolic readings. His arm cuff is about 56 years old. Blood pressure looks similar today to his recent hypertension clinic reading, with persistent elevated diastolic pressure.  09/24/2016  Daniel Franklin is seen today in follow-up. His blood pressures have been markedly improved. He brought a chart of them from November up until recently indicating systolic blood pressures between the high 90s to 120s systolic and diastolics between 50s and 80. He does feel a little dizzy occasionally with low blood pressure but then he starts exercising and blood pressure goes up accordingly. In general he's pleased with his current regimen.  04/05/2017  Daniel Franklin continues to do very well. His blood pressure is well-controlled now 100/78. He is asymptomatic. He denies any significant dizziness. He exercises regularly including walking, running and doing free hand weights. He denies any further alcohol use. He denies any shortness of breath, orthopnea, PND or other heart or symptoms. He has no chest pain. His vitals are stable weight is appropriate.  PMHx:  Past Medical History:  Diagnosis Date  . Hypertension     Past Surgical History:  Procedure Laterality Date  . CARDIAC CATHETERIZATION N/A 04/16/2016   Procedure: Left Heart Cath and Coronary Angiography;  Surgeon: Lyn Records, MD;  Location: [facility] INVASIVE CV LAB;  Service:  Cardiovascular;  Laterality: N/A;    FAMHx:  Family History  Problem Relation Age of Onset  . Heart attack Mother   . Heart disease Father   . Coronary artery disease Sister     SOCHx:   reports that he has never  smoked. He has never used smokeless tobacco. He reports that he drinks alcohol. He reports that he does not use drugs.  ALLERGIES:  Allergies  Allergen Reactions  . Lisinopril Cough    ROS: Pertinent items noted in HPI and remainder of comprehensive ROS otherwise negative.  HOME MEDS: Current Outpatient Prescriptions  Medication Sig Dispense Refill  . aspirin 81 MG chewable tablet Chew 81 mg by mouth every morning.    Marland Kitchen BEE POLLEN PO Take 2 capsules by mouth 2 (two) times daily.    . carvedilol (COREG) 3.125 MG tablet TAKE 1 TABLET(3.125 MG) BY MOUTH TWICE DAILY WITH A MEAL 180 tablet 1  . fluticasone (FLONASE) 50 MCG/ACT nasal spray Place 2 sprays into both nostrils daily.    . hydrALAZINE (APRESOLINE) 25 MG tablet TAKE 1 TABLET(25 MG) BY MOUTH TWICE DAILY 60 tablet 11  . valsartan-hydrochlorothiazide (DIOVAN-HCT) 160-25 MG tablet TAKE 1 TABLET BY MOUTH DAILY 30 tablet 6   No current facility-administered medications for this visit.     LABS/IMAGING: No results found for this or any previous visit (from the past 48 hour(s)). No results found.  WEIGHTS: Wt Readings from Last 3 Encounters:  04/05/17 172 lb (78 kg)  09/24/16 178 lb 3.2 oz (80.8 kg)  07/12/16 172 lb (78 kg)    VITALS: BP 100/78   Pulse 65   Ht 6' (1.829 m)   Wt 172 lb (78 kg)   BMI 23.33 kg/m   EXAM: General appearance: alert and no distress Neck: no carotid bruit and no JVD Lungs: clear to auscultation bilaterally Heart: regular rate and rhythm, S1, S2 normal, no murmur, click, rub or gallop Abdomen: soft, non-tender; bowel sounds normal; no masses,  no organomegaly Extremities: extremities normal, atraumatic, no cyanosis or edema Pulses: 2+ and symmetric Skin: Skin color, texture, turgor normal. No rashes or lesions Neurologic: Grossly normal Psych: Pleasant  EKG: Normal sinus rhythm 65, nonspecific IVCD, nonspecific T wave changes  ASSESSMENT: 1. Hypertensive cardiomyopathy, nonischemic by  cath 04/2016 -EF 40-45% 2. Hypertensive heart disease with heart failure 3. Elevated serum creatinine  PLAN: 1.   [name] is a marked improvement in his hypertension and discontinued any alcohol use. He exercises more regularly and is asymptomatic. I suspect on this current regimen of medications is LVEF has improved. We'll plan a recheck echocardiogram which is scheduled in a few weeks. I'll contact him with those results. He understands that these medications are lifelong medications for him despite any increases in his ejection fraction that may occur. Follow-up with me annually or sooner as necessary.   Chrystie Nose, MD, [facility] Attending Cardiologist [facility]  [facility] 04/05/2017, 8:42 AM

## 2017-04-26 ENCOUNTER — Other Ambulatory Visit: Payer: Self-pay

## 2017-04-26 ENCOUNTER — Ambulatory Visit (HOSPITAL_COMMUNITY): Payer: BLUE CROSS/BLUE SHIELD | Attending: Cardiovascular Disease

## 2017-04-26 DIAGNOSIS — I119 Hypertensive heart disease without heart failure: Secondary | ICD-10-CM | POA: Diagnosis not present

## 2017-04-26 DIAGNOSIS — I080 Rheumatic disorders of both mitral and aortic valves: Secondary | ICD-10-CM | POA: Diagnosis not present

## 2017-04-26 DIAGNOSIS — I428 Other cardiomyopathies: Secondary | ICD-10-CM

## 2017-04-26 DIAGNOSIS — I429 Cardiomyopathy, unspecified: Secondary | ICD-10-CM | POA: Diagnosis present

## 2017-04-26 LAB — ECHOCARDIOGRAM COMPLETE
CHL CUP REG VEL DIAS: 86.9 cm/s
E decel time: 201 msec
EERAT: 6.79
FS: 36 % (ref 28–44)
IV/PV OW: 0.76
LA diam end sys: 39 mm
LA diam index: 1.95 cm/m2
LA vol A4C: 62 ml
LA vol index: 38.5 mL/m2
LASIZE: 39 mm
LAVOL: 77 mL
LV E/e' medial: 6.79
LVEEAVG: 6.79
LVELAT: 14.1 cm/s
LVOT area: 3.46 cm2
LVOT diameter: 21 mm
MV Dec: 201
MV Peak grad: 4 mmHg
MV pk A vel: 69.6 m/s
MVPKEVEL: 95.8 m/s
PW: 11.9 mm — AB (ref 0.6–1.1)
Reg peak vel: 227 cm/s
TDI e' lateral: 14.1
TDI e' medial: 6.47
TRMAXVEL: 227 cm/s

## 2017-05-03 ENCOUNTER — Telehealth: Payer: Self-pay | Admitting: Internal Medicine

## 2017-05-03 MED ORDER — HYDROCHLOROTHIAZIDE 25 MG PO TABS: 25.0000 mg | ORAL_TABLET | Freq: Every day | ORAL | 3 refills | Status: DC

## 2017-05-03 MED ORDER — IRBESARTAN 150 MG PO TABS: 150.0000 mg | ORAL_TABLET | Freq: Every day | ORAL | 5 refills | Status: DC

## 2017-05-03 NOTE — Telephone Encounter (Signed)
New Message  Pt c/o medication issue:  1. Name of Medication: Valsartan   2. How are you currently taking this medication (dosage and times per day)? 160-25mg    3. Are you having a reaction (difficulty breathing--STAT)? No   4. What is your medication issue? Please call back to discuss a new med due to recall on medication.

## 2017-05-03 NOTE — Telephone Encounter (Signed)
S/w pt he is currently taking valsartan-HCTZ 160-25mg ,as directed by [facility] current memo will change to Irbesartan 150mg  and HCTZ 25mg  we will have to send 2 rx's as this does not come in the dose combination, pt verbalizes understanding of the new 2 rx's. Patient has home BP cuff, pt will take BP at home every 2-3 days for 2 weeks to ensure no significant changes in BP. He will call with any BP changes s/e for further directions Refill sent as requested, med list updated

## 2017-08-15 ENCOUNTER — Other Ambulatory Visit: Payer: Self-pay | Admitting: Internal Medicine

## 2017-10-14 ENCOUNTER — Other Ambulatory Visit: Payer: Self-pay | Admitting: Internal Medicine

## 2017-11-03 ENCOUNTER — Other Ambulatory Visit: Payer: Self-pay | Admitting: Nurse Practitioner

## 2017-11-03 ENCOUNTER — Other Ambulatory Visit: Payer: Self-pay | Admitting: Internal Medicine

## 2017-12-04 ENCOUNTER — Other Ambulatory Visit: Payer: Self-pay | Admitting: Internal Medicine

## 2017-12-14 ENCOUNTER — Other Ambulatory Visit: Payer: Self-pay | Admitting: Internal Medicine

## 2017-12-16 NOTE — Telephone Encounter (Signed)
REFILL 

## 2017-12-17 ENCOUNTER — Other Ambulatory Visit: Payer: Self-pay | Admitting: *Deleted

## 2017-12-17 MED ORDER — HYDROCHLOROTHIAZIDE 25 MG PO TABS: 25.0000 mg | ORAL_TABLET | Freq: Every day | ORAL | 5 refills | Status: DC

## 2018-01-05 ENCOUNTER — Other Ambulatory Visit: Payer: Self-pay | Admitting: Internal Medicine

## 2018-02-03 ENCOUNTER — Other Ambulatory Visit: Payer: Self-pay | Admitting: Nurse Practitioner

## 2018-03-30 ENCOUNTER — Other Ambulatory Visit: Payer: Self-pay | Admitting: Nurse Practitioner

## 2018-05-06 ENCOUNTER — Other Ambulatory Visit: Payer: Self-pay | Admitting: Internal Medicine

## 2018-05-18 ENCOUNTER — Other Ambulatory Visit: Payer: Self-pay | Admitting: Internal Medicine

## 2018-06-06 ENCOUNTER — Other Ambulatory Visit: Payer: Self-pay | Admitting: Internal Medicine

## 2018-06-07 ENCOUNTER — Other Ambulatory Visit: Payer: Self-pay | Admitting: Nurse Practitioner

## 2018-06-10 ENCOUNTER — Encounter: Payer: Self-pay | Admitting: Internal Medicine

## 2018-06-10 ENCOUNTER — Ambulatory Visit (INDEPENDENT_AMBULATORY_CARE_PROVIDER_SITE_OTHER): Payer: BLUE CROSS/BLUE SHIELD | Admitting: Internal Medicine

## 2018-06-10 VITALS — BP 126/86 | HR 67 | Ht 72.0 in | Wt 157.0 lb

## 2018-06-10 DIAGNOSIS — R7989 Other specified abnormal findings of blood chemistry: Secondary | ICD-10-CM

## 2018-06-10 DIAGNOSIS — I110 Hypertensive heart disease with heart failure: Secondary | ICD-10-CM

## 2018-06-10 DIAGNOSIS — I10 Essential (primary) hypertension: Secondary | ICD-10-CM

## 2018-06-10 NOTE — Patient Instructions (Signed)
Your physician wants you to follow-up in: ONE YEAR with Dr. Hilty. You will receive a reminder letter in the mail two months in advance. If you don't receive a letter, please call our office to schedule the follow-up appointment.  

## 2018-06-10 NOTE — Progress Notes (Signed)
OFFICE NOTE  Chief Complaint:  No complaints  Primary Care Physician: Patient, No Pcp Per  HPI:  Daniel Franklin is a 57 y.o. male with a past medical history significant for hypertension with medication noncompliance. He reports 2 weeks of progressive chest tightness, but denied cough or shortness of breath. On admission, blood pressure is 183/117. Troponin was noted to be elevated 0.08 and rose to 0.22. Creatinine is mildly elevated at 1.25.he was treated for hypertensive urgency and placed on amlodipine 10 mg daily. Yesterday he had a 20 beat run of asymptomatic V. Tach. He underwent an echocardiogram which demonstrated a newly reduced LVEF to 30-35%. There is diffuse hypokinesis although regional wall motion abnormality is cannot be excluded. Note: I personally reviewed his echocardiogram and believe his EF is actually higher than 35%, but is decreased likely more in the 40-45% range. Blood pressure now appears to be much better controlled this morning at 117/65. Medications were adjusted and currently he is on BiDil20/37.5 mg 3 times a day and carvedilol 6.25 twice a day. Cardiology is asked to consult regarding an SVT and new cardiomyopathy. Of note there was no further NSVT noted overnight, although there was lead artifact on telemetry monitoring which was treated and irregularly irregular tachycardia with rates about 250 which was clearly artifact. He underwent left heart catheterization which showed normal coronaries. After discharge he was not taking BiDil due to the cost of the medication. He was seen by Norma Fredrickson, NP, who started him on low-dose lisinopril. He's also on low-dose carvedilol. Heart rate is in the upper 50s and will not allow titration of this. Blood pressure remains suboptimally controlled. He was seen in the emergency department for uncontrolled hypertension on August 1 but left before being seen by the physician. Blood work showed stable creatinine of  1.4.  06/22/2016  Daniel Franklin returns today for follow-up. In the interim, he was seen in the hypertension clinic and blood pressure appeared to be improved. Office BP was 124/90, however, his home readings were higher. He returns today with his home BP readings. We checked his bp with his cuff and ours and noted his cuff was 10-20 points higher for both systolic and diastolic readings. His arm cuff is about 58 years old. Blood pressure looks similar today to his recent hypertension clinic reading, with persistent elevated diastolic pressure.  09/24/2016  Daniel Franklin is seen today in follow-up. His blood pressures have been markedly improved. He brought a chart of them from November up until recently indicating systolic blood pressures between the high 90s to 120s systolic and diastolics between 50s and 80. He does feel a little dizzy occasionally with low blood pressure but then he starts exercising and blood pressure goes up accordingly. In general he's pleased with his current regimen.  04/05/2017  Daniel Franklin continues to do very well. His blood pressure is well-controlled now 100/78. He is asymptomatic. He denies any significant dizziness. He exercises regularly including walking, running and doing free hand weights. He denies any further alcohol use. He denies any shortness of breath, orthopnea, PND or other heart or symptoms. He has no chest pain. His vitals are stable weight is appropriate.  06/10/2018  Daniel Franklin seen today in follow-up.  He denies any chest pain or worsening shortness of breath.  His last echocardiogram was in July 2018 which showed improvement in LVEF to 50 to 55%.  He is had very good blood pressure control.  Blood pressure today was 126/86  but he says in general is lower than that at home.  He denies any further alcohol use.  We talked about his medications at length today and he now understands that he needs to take these medicines probably for the rest of his life.  In  general he is okay with that.  PMHx:  Past Medical History:  Diagnosis Date  . Hypertension     Past Surgical History:  Procedure Laterality Date  . CARDIAC CATHETERIZATION N/A 04/16/2016   Procedure: Left Heart Cath and Coronary Angiography;  Surgeon: Lyn Records, MD;  Location: [facility] INVASIVE CV LAB;  Service: Cardiovascular;  Laterality: N/A;    FAMHx:  Family History  Problem Relation Age of Onset  . Heart attack Mother   . Heart disease Father   . Coronary artery disease Sister     SOCHx:   reports that he has never smoked. He has never used smokeless tobacco. He reports that he drinks alcohol. He reports that he does not use drugs.  ALLERGIES:  Allergies  Allergen Reactions  . Lisinopril Cough    ROS: Pertinent items noted in HPI and remainder of comprehensive ROS otherwise negative.  HOME MEDS: Current Outpatient Medications  Medication Sig Dispense Refill  . aspirin 81 MG chewable tablet Chew 81 mg by mouth every morning.    Marland Kitchen BEE POLLEN PO Take 2 capsules by mouth 2 (two) times daily.    . carvedilol (COREG) 3.125 MG tablet TAKE 1 TABLET BY MOUTH TWICE DAILY WITH A MEAL 180 tablet 0  . fluticasone (FLONASE) 50 MCG/ACT nasal spray Place 2 sprays into both nostrils daily.    . hydrALAZINE (APRESOLINE) 25 MG tablet TAKE 1 TABLET(25 MG) BY MOUTH TWICE DAILY 60 tablet 0  . hydrochlorothiazide (HYDRODIURIL) 25 MG tablet Take 1 tablet (25 mg total) by mouth daily. 30 tablet 5  . hydrochlorothiazide (HYDRODIURIL) 25 MG tablet Take 1 tablet (25 mg total) by mouth daily. NEED OV. 30 tablet 0  . irbesartan (AVAPRO) 150 MG tablet TAKE 1 TABLET(150 MG) BY MOUTH DAILY 30 tablet 9   No current facility-administered medications for this visit.     LABS/IMAGING: No results found for this or any previous visit (from the past 48 hour(s)). No results found.  WEIGHTS: Wt Readings from Last 3 Encounters:  06/10/18 157 lb (71.2 kg)  04/05/17 172 lb (78 kg)  09/24/16 178 lb 3.2  oz (80.8 kg)    VITALS: BP 126/86 (BP Location: Left Arm, Patient Position: Sitting, Cuff Size: Normal)   Pulse 67   Ht 6' (1.829 m)   Wt 157 lb (71.2 kg)   BMI 21.29 kg/m   EXAM: General appearance: alert and no distress Neck: no carotid bruit and no JVD Lungs: clear to auscultation bilaterally Heart: regular rate and rhythm, S1, S2 normal, no murmur, click, rub or gallop Abdomen: soft, non-tender; bowel sounds normal; no masses,  no organomegaly Extremities: extremities normal, atraumatic, no cyanosis or edema Pulses: 2+ and symmetric Skin: Skin color, texture, turgor normal. No rashes or lesions Neurologic: Grossly normal Psych: Pleasant  EKG: Normal sinus rhythm at 67, minimal voltage criteria for LVH, nonspecific T wave changes-personally reviewed  ASSESSMENT: 1. Hypertensive cardiomyopathy, nonischemic by cath 04/2016 -EF 40-45% (improved to 50 to 55%, 04/2017) 2. Hypertensive heart disease with heart failure  3. Elevated serum creatinine  PLAN: 1.   Daniel Franklin continues to do well.  His EF is improved up to 50 to 55% in July 2018.  He denies  any worsening chest pain or shortness of breath.  He has NYHA class I symptoms.  Blood pressure is well controlled.  He will need to remain on his blood pressure medicines indefinitely.  Follow-up with me annually or sooner as necessary.  Chrystie Nose, MD, [facility], FACP  [place]  [facility]  Medical Director of the [facility]  Cardiovascular Risk Reduction Clinic Diplomate of the American Board of Clinical Lipidology Attending Cardiologist  Direct Dial: [phone]  Fax: [phone]  Website:  [email or web address] 06/10/2018, 4:05 PM

## 2018-07-07 ENCOUNTER — Other Ambulatory Visit: Payer: Self-pay | Admitting: Internal Medicine

## 2018-08-11 ENCOUNTER — Other Ambulatory Visit: Payer: Self-pay | Admitting: Internal Medicine

## 2018-08-19 ENCOUNTER — Other Ambulatory Visit: Payer: Self-pay

## 2018-08-19 ENCOUNTER — Emergency Department (HOSPITAL_COMMUNITY): Payer: BLUE CROSS/BLUE SHIELD

## 2018-08-19 ENCOUNTER — Emergency Department (HOSPITAL_COMMUNITY)
Admission: EM | Admit: 2018-08-19 | Discharge: 2018-08-19 | Disposition: A | Discharge status: Home / Self Care | Payer: BLUE CROSS/BLUE SHIELD | Attending: Emergency Medicine | Admitting: Emergency Medicine

## 2018-08-19 DIAGNOSIS — N189 Chronic kidney disease, unspecified: Secondary | ICD-10-CM | POA: Diagnosis not present

## 2018-08-19 DIAGNOSIS — Z79899 Other long term (current) drug therapy: Secondary | ICD-10-CM | POA: Diagnosis not present

## 2018-08-19 DIAGNOSIS — I5022 Chronic systolic (congestive) heart failure: Secondary | ICD-10-CM | POA: Insufficient documentation

## 2018-08-19 DIAGNOSIS — E876 Hypokalemia: Secondary | ICD-10-CM | POA: Diagnosis not present

## 2018-08-19 DIAGNOSIS — R079 Chest pain, unspecified: Secondary | ICD-10-CM | POA: Diagnosis not present

## 2018-08-19 DIAGNOSIS — I130 Hypertensive heart and chronic kidney disease with heart failure and stage 1 through stage 4 chronic kidney disease, or unspecified chronic kidney disease: Secondary | ICD-10-CM | POA: Insufficient documentation

## 2018-08-19 DIAGNOSIS — T502X5A Adverse effect of carbonic-anhydrase inhibitors, benzothiadiazides and other diuretics, initial encounter: Secondary | ICD-10-CM

## 2018-08-19 DIAGNOSIS — Z7982 Long term (current) use of aspirin: Secondary | ICD-10-CM | POA: Diagnosis not present

## 2018-08-19 DIAGNOSIS — N289 Disorder of kidney and ureter, unspecified: Secondary | ICD-10-CM

## 2018-08-19 LAB — CBC
HEMATOCRIT: 44.6 % (ref 39.0–52.0)
HEMOGLOBIN: 15.4 g/dL (ref 13.0–17.0)
MCH: 29.3 pg (ref 26.0–34.0)
MCHC: 34.5 g/dL (ref 30.0–36.0)
MCV: 84.8 fL (ref 80.0–100.0)
Platelets: 212 10*3/uL (ref 150–400)
RBC: 5.26 MIL/uL (ref 4.22–5.81)
RDW: 12.9 % (ref 11.5–15.5)
WBC: 6.3 10*3/uL (ref 4.0–10.5)
nRBC: 0 % (ref 0.0–0.2)

## 2018-08-19 LAB — I-STAT TROPONIN, ED: Troponin i, poc: 0.01 ng/mL (ref 0.00–0.08)

## 2018-08-19 LAB — BASIC METABOLIC PANEL
Anion gap: 9 (ref 5–15)
BUN: 14 mg/dL (ref 6–20)
CHLORIDE: 102 mmol/L (ref 98–111)
CO2: 26 mmol/L (ref 22–32)
CREATININE: 1.49 mg/dL — AB (ref 0.61–1.24)
Calcium: 9.3 mg/dL (ref 8.9–10.3)
GFR calc Af Amer: 58 mL/min — ABNORMAL LOW (ref >60)
GFR calc non Af Amer: 50 mL/min — ABNORMAL LOW (ref >60)
GLUCOSE: 150 mg/dL — AB (ref 70–99)
POTASSIUM: 3 mmol/L — AB (ref 3.5–5.1)
Sodium: 137 mmol/L (ref 135–145)

## 2018-08-19 LAB — MAGNESIUM: Magnesium: 1.8 mg/dL (ref 1.7–2.4)

## 2018-08-19 LAB — TROPONIN I: Troponin I: <0.03 ng/mL (ref <0.03)

## 2018-08-19 MED ORDER — POTASSIUM CHLORIDE CRYS ER 20 MEQ PO TBCR: 20.0000 meq | EXTENDED_RELEASE_TABLET | Freq: Two times a day (BID) | ORAL | 0 refills | Status: AC

## 2018-08-19 MED ORDER — POTASSIUM CHLORIDE CRYS ER 20 MEQ PO TBCR
40.0000 meq | EXTENDED_RELEASE_TABLET | Freq: Once | ORAL | Status: AC
  Administered 2018-08-19: 40 meq via ORAL
  Filled 2018-08-19: qty 2

## 2018-08-19 MED ORDER — NITROGLYCERIN 0.4 MG SL SUBL: 0.4000 mg | SUBLINGUAL_TABLET | SUBLINGUAL | Status: DC | PRN

## 2018-08-19 MED ORDER — ASPIRIN 81 MG PO CHEW
324.0000 mg | CHEWABLE_TABLET | Freq: Once | ORAL | Status: AC
  Administered 2018-08-19: 324 mg via ORAL
  Filled 2018-08-19: qty 4

## 2018-08-19 NOTE — ED Notes (Signed)
Patient verbalizes understanding of medications and discharge instructions. No further questions at this time. VSS and patient ambulatory at discharge.   

## 2018-08-19 NOTE — ED Provider Notes (Signed)
[facility] EMERGENCY DEPARTMENT Provider Note   CSN: [id number] Arrival date & time: 08/19/18  0334     History   Chief Complaint Chief Complaint  Patient presents with  . Chest Pain    HPI Daniel Franklin is a 57 y.o. male.  The history is provided by the patient.  He has history of hypertension, systolic heart failure, chronic kidney disease and comes in following an episode of chest discomfort.  At about 11:30 PM, he had onset of dyspnea followed by tightness in his chest and dry mouth.  There was nausea but no vomiting.  He denies diaphoresis.  Nothing made symptoms better, nothing makes them worse.  Tightness was never severe and he rated it at 4/10.  It is down to 2/10.  He took his blood pressure at home and it was significantly elevated.  He states he normally checks his blood pressure every day and it normally runs in the normal range.  He states he has symptoms like this fairly frequently, but he does not have any exertional symptoms.  Tonight's episode came on while he was at rest.  Nothing makes his symptoms better, nothing makes it worse.  Past Medical History:  Diagnosis Date  . Hypertension     Patient Active Problem List   Diagnosis Date Noted  . Essential hypertension 04/05/2017  . Chronic systolic (congestive) heart failure (HCC) 05/23/2016  . Hypertensive heart disease with heart failure (HCC) 05/23/2016  . Myocardiopathy (HCC)   . Hypertensive urgency 04/14/2016  . Elevated serum creatinine 04/14/2016  . Chest pain 04/14/2016  . AKI (acute kidney injury) (HCC) 04/14/2016  . Chest tightness 04/14/2016  . NSTEMI (non-ST elevated myocardial infarction) (HCC) 04/14/2016  . Pain in the chest     Past Surgical History:  Procedure Laterality Date  . CARDIAC CATHETERIZATION N/A 04/16/2016   Procedure: Left Heart Cath and Coronary Angiography;  Surgeon: Lyn Records, MD;  Location: [facility] INVASIVE CV LAB;  Service: Cardiovascular;  Laterality:  N/A;        Home Medications    Prior to Admission medications   Medication Sig Start Date End Date Taking? Authorizing Provider  aspirin 81 MG chewable tablet Chew 81 mg by mouth every morning.    [provider]  BEE POLLEN PO Take 2 capsules by mouth 2 (two) times daily.    [provider]  carvedilol (COREG) 3.125 MG tablet TAKE 1 TABLET BY MOUTH TWICE DAILY WITH A MEAL 06/09/18   Rosalio Macadamia, NP  fluticasone (FLONASE) 50 MCG/ACT nasal spray Place 2 sprays into both nostrils daily.    [provider]  hydrALAZINE (APRESOLINE) 25 MG tablet TAKE 1 TABLET(25 MG) BY MOUTH TWICE DAILY 07/07/18   Hilty, Lisette Abu, MD  hydrochlorothiazide (HYDRODIURIL) 25 MG tablet Take 1 tablet (25 mg total) by mouth daily. 12/17/17   Hilty, Lisette Abu, MD  hydrochlorothiazide (HYDRODIURIL) 25 MG tablet Take 1 tablet (25 mg total) by mouth daily. NEED OV. 05/19/18   Chrystie Nose, MD  irbesartan (AVAPRO) 150 MG tablet TAKE 1 TABLET(150 MG) BY MOUTH DAILY 08/11/18   Hilty, Lisette Abu, MD    Family History Family History  Problem Relation Age of Onset  . Heart attack Mother   . Heart disease Father   . Coronary artery disease Sister     Social History Social History   Tobacco Use  . Smoking status: Never Smoker  . Smokeless tobacco: Never Used  Substance Use Topics  .  Alcohol use: Yes    Comment: 1-2 x a week  . Drug use: No     Allergies   Lisinopril   Review of Systems Review of Systems  All other systems reviewed and are negative.    Physical Exam Updated Vital Signs BP 127/89   Pulse 68   Temp 98 F (36.7 C) (Oral)   Resp 16   Ht 6' (1.829 m)   Wt 78.5 kg   SpO2 98%   BMI 23.46 kg/m   Physical Exam  Nursing note and vitals reviewed.  57 year old male, resting comfortably and in no acute distress. Vital signs are normal. Oxygen saturation is 98%, which is normal. Head is normocephalic and atraumatic. PERRLA, EOMI. Oropharynx is clear. Neck  is nontender and supple without adenopathy or JVD. Back is nontender and there is no CVA tenderness. Lungs are clear without rales, wheezes, or rhonchi. Chest is nontender. Heart has regular rate and rhythm without murmur. Abdomen is soft, flat, nontender without masses or hepatosplenomegaly and peristalsis is normoactive. Extremities have no cyanosis or edema, full range of motion is present. Skin is warm and dry without rash. Neurologic: Mental status is normal, cranial nerves are intact, there are no motor or sensory deficits.  ED Treatments / Results  Labs (all labs ordered are listed, but only abnormal results are displayed) Labs Reviewed  BASIC METABOLIC PANEL - Abnormal; Notable for the following components:      Result Value   Potassium 3.0 (*)    Glucose, Bld 150 (*)    Creatinine, Ser 1.49 (*)    GFR calc non Af Amer 50 (*)    GFR calc Af Amer 58 (*)    All other components within normal limits  CBC  TROPONIN I  MAGNESIUM  I-STAT TROPONIN, ED  I-STAT TROPONIN, ED    EKG EKG Interpretation  Date/Time:  Tuesday August 19 2018 03:39:57 EST Ventricular Rate:  98 PR Interval:  172 QRS Duration: 108 QT Interval:  352 QTC Calculation: 449 R Axis:   -10 Text Interpretation:  Normal sinus rhythm Normal ECG When compared with ECG of 05/31/2016, No significant change was found Confirmed by Dione Booze ([id number]) on 08/19/2018 4:50:41 AM   Radiology Dg Chest 2 View  Result Date: 08/19/2018 CLINICAL DATA:  Chest pain, dry mouth, and hypertension starting today. EXAM: CHEST - 2 VIEW COMPARISON:  05/15/2016 FINDINGS: The heart size and mediastinal contours are within normal limits. Both lungs are clear. The visualized skeletal structures are unremarkable. IMPRESSION: No active cardiopulmonary disease. Electronically Signed   By: Burman Nieves M.D.   On: 08/19/2018 04:06    Procedures Procedures  Medications Ordered in ED Medications  nitroGLYCERIN (NITROSTAT) SL  tablet 0.4 mg (has no administration in time range)  aspirin chewable tablet 324 mg (324 mg Oral Given 08/19/18 0539)  potassium chloride SA (K-DUR,KLOR-CON) CR tablet 40 mEq (40 mEq Oral Given 08/19/18 0539)     Initial Impression / Assessment and Plan / ED Course  I have reviewed the triage vital signs and the nursing notes.  Pertinent labs & imaging results that were available during my care of the patient were reviewed by me and considered in my medical decision making (see chart for details).  Chest discomfort of uncertain cause.  All records are reviewed, and he had a cardiac catheterization in July 2017 showing normal coronary arteries.  At that time, he had a non-STEMI which was felt to be related to uncontrolled hypertension.  Of note, ejection fraction improved from 30-35% to 50-55% 1 year ago.  With known normal coronary arteries, it is very unlikely that he has ACS.  He will be given aspirin and nitroglycerin, will check delta troponin.  He never received nitroglycerin, but chest tightness has completely resolved.  Labs show mild renal insufficiency which is not significantly changed from baseline.  He has mild to moderate hypokalemia which is presumably secondary to his diuretic use.  Initial troponin is normal and repeat troponin does not show any change.  He is felt to be safe for discharge.  He is discharged with prescription for K-Dur, follow-up with his cardiologist and with his primary care provider.  Return precautions discussed..  Final Clinical Impressions(s) / ED Diagnoses   Final diagnoses:  Nonspecific chest pain  Renal insufficiency  Diuretic-induced hypokalemia    ED Discharge Orders         Ordered    potassium chloride SA (K-DUR,KLOR-CON) 20 MEQ tablet  2 times daily     08/19/18 0703           Dione Booze, MD 08/19/18 [id number]

## 2018-08-19 NOTE — ED Triage Notes (Signed)
Patient c/o CP, dry mouth and hypertension that started today. States that he takes BP meds for same.

## 2018-08-19 NOTE — Discharge Instructions (Addendum)
Return if you have any problems.

## 2018-08-27 ENCOUNTER — Other Ambulatory Visit: Payer: Self-pay | Admitting: Nurse Practitioner

## 2019-01-06 ENCOUNTER — Other Ambulatory Visit: Payer: Self-pay | Admitting: Internal Medicine

## 2019-01-06 ENCOUNTER — Other Ambulatory Visit: Payer: Self-pay | Admitting: Nurse Practitioner

## 2019-01-06 NOTE — Telephone Encounter (Signed)
 *  STAT* If patient is at the pharmacy, call can be transferred to refill team.   1. Which medications need to be refilled? (please list name of each medication and dose if known)  carvedilol (COREG) 3.125 MG tablet hydrALAZINE (APRESOLINE) 25 MG tablet hydrochlorothiazide (HYDRODIURIL) 25 MG tablet irbesartan (AVAPRO) 150 MG tablet   2. Which pharmacy/location (including street and city if local pharmacy) is medication to be sent to? [facility], [employer]  3. Do they need a 30 day or 90 day supply? 30 day

## 2019-01-07 MED ORDER — HYDROCHLOROTHIAZIDE 25 MG PO TABS: 25.0000 mg | ORAL_TABLET | Freq: Every day | ORAL | 6 refills | Status: AC

## 2019-01-07 MED ORDER — CARVEDILOL 3.125 MG PO TABS: 3.1250 mg | ORAL_TABLET | Freq: Two times a day (BID) | ORAL | 6 refills | Status: AC

## 2019-01-07 MED ORDER — HYDRALAZINE HCL 25 MG PO TABS: ORAL_TABLET | ORAL | 6 refills | Status: AC

## 2019-01-07 MED ORDER — IRBESARTAN 150 MG PO TABS: ORAL_TABLET | ORAL | 6 refills | Status: AC

## 2020-12-14 ENCOUNTER — Encounter: Payer: Self-pay | Admitting: General Practice
